# Patient Record
Sex: Female | Born: 1940 | Race: White | Hispanic: No | Marital: Married | Smoking: Never smoker
Health system: Southern US, Community
[De-identification: ages and names within clinical notes are randomized; demographics above are authoritative.]

## PROBLEM LIST (undated history)

## (undated) DIAGNOSIS — C50919 Malignant neoplasm of unspecified site of unspecified female breast: Secondary | ICD-10-CM

## (undated) DIAGNOSIS — E039 Hypothyroidism, unspecified: Secondary | ICD-10-CM

## (undated) DIAGNOSIS — E559 Vitamin D deficiency, unspecified: Principal | ICD-10-CM

## (undated) DIAGNOSIS — R251 Tremor, unspecified: Secondary | ICD-10-CM

## (undated) DIAGNOSIS — Z803 Family history of malignant neoplasm of breast: Secondary | ICD-10-CM

## (undated) DIAGNOSIS — Z923 Personal history of irradiation: Secondary | ICD-10-CM

## (undated) DIAGNOSIS — M8580 Other specified disorders of bone density and structure, unspecified site: Secondary | ICD-10-CM

## (undated) DIAGNOSIS — R569 Unspecified convulsions: Secondary | ICD-10-CM

## (undated) DIAGNOSIS — I10 Essential (primary) hypertension: Secondary | ICD-10-CM

## (undated) DIAGNOSIS — C801 Malignant (primary) neoplasm, unspecified: Secondary | ICD-10-CM

## (undated) HISTORY — PX: THYROIDECTOMY: SHX17

## (undated) HISTORY — DX: Family history of malignant neoplasm of breast: Z80.3

## (undated) HISTORY — DX: Vitamin D deficiency, unspecified: E55.9

## (undated) HISTORY — DX: Malignant (primary) neoplasm, unspecified: C80.1

## (undated) HISTORY — PX: EYE SURGERY: SHX253

## (undated) HISTORY — DX: Other specified disorders of bone density and structure, unspecified site: M85.80

---

## 1968-01-09 HISTORY — PX: ABDOMINAL HYSTERECTOMY: SHX81

## 1998-08-31 ENCOUNTER — Other Ambulatory Visit: Admission: RE | Admit: 1998-08-31 | Discharge: 1998-08-31 | Payer: Self-pay | Admitting: Obstetrics and Gynecology

## 1999-09-07 ENCOUNTER — Other Ambulatory Visit: Admission: RE | Admit: 1999-09-07 | Discharge: 1999-09-07 | Payer: Self-pay | Admitting: Obstetrics and Gynecology

## 2000-10-31 ENCOUNTER — Other Ambulatory Visit: Admission: RE | Admit: 2000-10-31 | Discharge: 2000-10-31 | Payer: Self-pay | Admitting: Obstetrics and Gynecology

## 2000-11-19 ENCOUNTER — Encounter: Payer: Self-pay | Admitting: Obstetrics and Gynecology

## 2000-11-19 ENCOUNTER — Encounter: Admission: RE | Admit: 2000-11-19 | Discharge: 2000-11-19 | Payer: Self-pay | Admitting: Obstetrics and Gynecology

## 2001-01-08 DIAGNOSIS — C50919 Malignant neoplasm of unspecified site of unspecified female breast: Secondary | ICD-10-CM

## 2001-01-08 HISTORY — DX: Malignant neoplasm of unspecified site of unspecified female breast: C50.919

## 2001-06-03 ENCOUNTER — Encounter (INDEPENDENT_AMBULATORY_CARE_PROVIDER_SITE_OTHER): Payer: Self-pay | Admitting: *Deleted

## 2001-06-03 ENCOUNTER — Ambulatory Visit (HOSPITAL_BASED_OUTPATIENT_CLINIC_OR_DEPARTMENT_OTHER): Admission: RE | Admit: 2001-06-03 | Discharge: 2001-06-03 | Payer: Self-pay | Admitting: *Deleted

## 2001-06-09 ENCOUNTER — Encounter: Admission: RE | Admit: 2001-06-09 | Discharge: 2001-06-09 | Payer: Self-pay | Admitting: *Deleted

## 2001-06-09 ENCOUNTER — Encounter: Payer: Self-pay | Admitting: *Deleted

## 2001-06-10 ENCOUNTER — Encounter: Payer: Self-pay | Admitting: *Deleted

## 2001-06-10 ENCOUNTER — Ambulatory Visit (HOSPITAL_BASED_OUTPATIENT_CLINIC_OR_DEPARTMENT_OTHER): Admission: RE | Admit: 2001-06-10 | Discharge: 2001-06-10 | Payer: Self-pay | Admitting: *Deleted

## 2001-06-10 ENCOUNTER — Encounter (INDEPENDENT_AMBULATORY_CARE_PROVIDER_SITE_OTHER): Payer: Self-pay | Admitting: Specialist

## 2001-06-10 HISTORY — PX: BREAST LUMPECTOMY: SHX2

## 2001-06-19 ENCOUNTER — Ambulatory Visit: Admission: RE | Admit: 2001-06-19 | Discharge: 2001-09-17 | Payer: Self-pay | Admitting: Radiation Oncology

## 2001-07-09 ENCOUNTER — Encounter: Payer: Self-pay | Admitting: Oncology

## 2001-07-09 ENCOUNTER — Ambulatory Visit (HOSPITAL_COMMUNITY): Admission: RE | Admit: 2001-07-09 | Discharge: 2001-07-09 | Payer: Self-pay | Admitting: Oncology

## 2001-07-14 ENCOUNTER — Encounter: Payer: Self-pay | Admitting: *Deleted

## 2001-07-14 ENCOUNTER — Ambulatory Visit (HOSPITAL_BASED_OUTPATIENT_CLINIC_OR_DEPARTMENT_OTHER): Admission: RE | Admit: 2001-07-14 | Discharge: 2001-07-14 | Payer: Self-pay | Admitting: *Deleted

## 2001-10-08 ENCOUNTER — Ambulatory Visit: Admission: RE | Admit: 2001-10-08 | Discharge: 2001-12-09 | Payer: Self-pay | Admitting: Radiation Oncology

## 2001-12-24 ENCOUNTER — Ambulatory Visit (HOSPITAL_BASED_OUTPATIENT_CLINIC_OR_DEPARTMENT_OTHER): Admission: RE | Admit: 2001-12-24 | Discharge: 2001-12-24 | Payer: Self-pay | Admitting: *Deleted

## 2002-01-13 ENCOUNTER — Ambulatory Visit: Admission: RE | Admit: 2002-01-13 | Discharge: 2002-01-13 | Payer: Self-pay | Admitting: Radiation Oncology

## 2002-05-04 ENCOUNTER — Other Ambulatory Visit: Admission: RE | Admit: 2002-05-04 | Discharge: 2002-05-04 | Payer: Self-pay | Admitting: Gynecology

## 2002-06-02 ENCOUNTER — Encounter: Admission: RE | Admit: 2002-06-02 | Discharge: 2002-06-02 | Payer: Self-pay | Admitting: Oncology

## 2002-06-02 ENCOUNTER — Encounter: Payer: Self-pay | Admitting: Oncology

## 2002-11-17 ENCOUNTER — Encounter: Admission: RE | Admit: 2002-11-17 | Discharge: 2002-11-17 | Payer: Self-pay | Admitting: Oncology

## 2003-06-04 ENCOUNTER — Encounter: Admission: RE | Admit: 2003-06-04 | Discharge: 2003-06-04 | Payer: Self-pay | Admitting: Oncology

## 2003-06-08 ENCOUNTER — Other Ambulatory Visit: Admission: RE | Admit: 2003-06-08 | Discharge: 2003-06-08 | Payer: Self-pay | Admitting: Gynecology

## 2004-01-18 ENCOUNTER — Ambulatory Visit: Payer: Self-pay | Admitting: Oncology

## 2004-06-06 ENCOUNTER — Encounter: Admission: RE | Admit: 2004-06-06 | Discharge: 2004-06-06 | Payer: Self-pay | Admitting: Oncology

## 2004-06-15 ENCOUNTER — Encounter: Admission: RE | Admit: 2004-06-15 | Discharge: 2004-06-15 | Payer: Self-pay | Admitting: Oncology

## 2004-06-20 ENCOUNTER — Encounter: Admission: RE | Admit: 2004-06-20 | Discharge: 2004-06-20 | Payer: Self-pay | Admitting: Oncology

## 2004-11-23 ENCOUNTER — Ambulatory Visit: Payer: Self-pay | Admitting: Oncology

## 2004-11-24 ENCOUNTER — Ambulatory Visit (HOSPITAL_COMMUNITY): Admission: RE | Admit: 2004-11-24 | Discharge: 2004-11-24 | Payer: Self-pay | Admitting: Oncology

## 2004-12-21 ENCOUNTER — Encounter: Admission: RE | Admit: 2004-12-21 | Discharge: 2004-12-21 | Payer: Self-pay | Admitting: Oncology

## 2005-01-15 ENCOUNTER — Ambulatory Visit: Payer: Self-pay | Admitting: Oncology

## 2005-06-11 ENCOUNTER — Encounter: Admission: RE | Admit: 2005-06-11 | Discharge: 2005-06-11 | Payer: Self-pay | Admitting: Obstetrics & Gynecology

## 2006-01-03 ENCOUNTER — Other Ambulatory Visit: Admission: RE | Admit: 2006-01-03 | Discharge: 2006-01-03 | Payer: Self-pay | Admitting: Gynecology

## 2006-01-05 ENCOUNTER — Ambulatory Visit: Payer: Self-pay | Admitting: Oncology

## 2006-01-10 LAB — CBC WITH DIFFERENTIAL/PLATELET
BASO%: 0.3 % (ref 0.0–2.0)
LYMPH%: 18.7 % (ref 14.0–48.0)
MCH: 30.9 pg (ref 26.0–34.0)
MCHC: 33.5 g/dL (ref 32.0–36.0)
MCV: 92.1 fL (ref 81.0–101.0)
MONO%: 9.8 % (ref 0.0–13.0)
Platelets: 303 10*3/uL (ref 145–400)
RBC: 4.49 10*6/uL (ref 3.70–5.32)

## 2006-01-10 LAB — COMPREHENSIVE METABOLIC PANEL
ALT: 13 U/L (ref 0–35)
Alkaline Phosphatase: 105 U/L (ref 39–117)
Sodium: 142 mEq/L (ref 135–145)
Total Bilirubin: 0.3 mg/dL (ref 0.3–1.2)
Total Protein: 6.9 g/dL (ref 6.0–8.3)

## 2006-06-24 ENCOUNTER — Encounter: Admission: RE | Admit: 2006-06-24 | Discharge: 2006-06-24 | Payer: Self-pay | Admitting: Surgery

## 2006-10-15 ENCOUNTER — Ambulatory Visit: Payer: Self-pay | Admitting: Oncology

## 2006-11-05 LAB — CBC WITH DIFFERENTIAL/PLATELET
Basophils Absolute: 0 10*3/uL (ref 0.0–0.1)
Eosinophils Absolute: 0.1 10*3/uL (ref 0.0–0.5)
HGB: 13 g/dL (ref 11.6–15.9)
LYMPH%: 19.7 % (ref 14.0–48.0)
MCH: 32 pg (ref 26.0–34.0)
MCV: 90.6 fL (ref 81.0–101.0)
MONO%: 8.3 % (ref 0.0–13.0)
NEUT#: 5.1 10*3/uL (ref 1.5–6.5)
Platelets: 247 10*3/uL (ref 145–400)
RBC: 4.05 10*6/uL (ref 3.70–5.32)

## 2006-11-06 LAB — COMPREHENSIVE METABOLIC PANEL
Alkaline Phosphatase: 95 U/L (ref 39–117)
BUN: 19 mg/dL (ref 6–23)
Creatinine, Ser: 0.83 mg/dL (ref 0.40–1.20)
Glucose, Bld: 99 mg/dL (ref 70–99)
Total Bilirubin: 0.3 mg/dL (ref 0.3–1.2)

## 2006-11-25 ENCOUNTER — Ambulatory Visit (HOSPITAL_COMMUNITY): Admission: RE | Admit: 2006-11-25 | Discharge: 2006-11-25 | Payer: Self-pay | Admitting: Oncology

## 2007-07-08 ENCOUNTER — Encounter: Admission: RE | Admit: 2007-07-08 | Discharge: 2007-07-08 | Payer: Self-pay | Admitting: Oncology

## 2007-10-14 ENCOUNTER — Ambulatory Visit: Payer: Self-pay | Admitting: Oncology

## 2007-10-20 LAB — CBC WITH DIFFERENTIAL/PLATELET
EOS%: 2.1 % (ref 0.0–7.0)
Eosinophils Absolute: 0.2 10*3/uL (ref 0.0–0.5)
MCV: 92.4 fL (ref 81.0–101.0)
MONO%: 10.2 % (ref 0.0–13.0)
NEUT#: 4.3 10*3/uL (ref 1.5–6.5)
RBC: 4.03 10*6/uL (ref 3.70–5.32)
RDW: 12.8 % (ref 11.3–14.5)

## 2007-10-21 LAB — COMPREHENSIVE METABOLIC PANEL
AST: 17 U/L (ref 0–37)
Albumin: 4.2 g/dL (ref 3.5–5.2)
Alkaline Phosphatase: 95 U/L (ref 39–117)
Glucose, Bld: 85 mg/dL (ref 70–99)
Potassium: 4 mEq/L (ref 3.5–5.3)
Sodium: 139 mEq/L (ref 135–145)
Total Protein: 6.2 g/dL (ref 6.0–8.3)

## 2008-07-23 ENCOUNTER — Encounter: Admission: RE | Admit: 2008-07-23 | Discharge: 2008-07-23 | Payer: Self-pay | Admitting: Gynecology

## 2008-10-15 ENCOUNTER — Ambulatory Visit: Payer: Self-pay | Admitting: Oncology

## 2008-10-19 LAB — CBC WITH DIFFERENTIAL/PLATELET
BASO%: 0.2 % (ref 0.0–2.0)
Basophils Absolute: 0 10*3/uL (ref 0.0–0.1)
EOS%: 2.1 % (ref 0.0–7.0)
HCT: 36.4 % (ref 34.8–46.6)
LYMPH%: 21.2 % (ref 14.0–49.7)
MCH: 31.6 pg (ref 25.1–34.0)
MCHC: 34.2 g/dL (ref 31.5–36.0)
MCV: 92.6 fL (ref 79.5–101.0)
MONO%: 11 % (ref 0.0–14.0)
NEUT%: 65.5 % (ref 38.4–76.8)
lymph#: 1.3 10*3/uL (ref 0.9–3.3)

## 2008-10-20 LAB — COMPREHENSIVE METABOLIC PANEL
ALT: 11 U/L (ref 0–35)
AST: 15 U/L (ref 0–37)
Alkaline Phosphatase: 90 U/L (ref 39–117)
BUN: 20 mg/dL (ref 6–23)
Creatinine, Ser: 0.79 mg/dL (ref 0.40–1.20)
Total Bilirubin: 0.2 mg/dL — ABNORMAL LOW (ref 0.3–1.2)

## 2009-07-28 ENCOUNTER — Encounter: Admission: RE | Admit: 2009-07-28 | Discharge: 2009-07-28 | Payer: Self-pay | Admitting: Surgery

## 2009-10-19 ENCOUNTER — Ambulatory Visit: Payer: Self-pay | Admitting: Oncology

## 2009-11-21 ENCOUNTER — Ambulatory Visit: Payer: Self-pay | Admitting: Oncology

## 2009-11-21 LAB — CBC WITH DIFFERENTIAL/PLATELET
BASO%: 0.2 % (ref 0.0–2.0)
Eosinophils Absolute: 0.2 10*3/uL (ref 0.0–0.5)
MONO#: 0.6 10*3/uL (ref 0.1–0.9)
MONO%: 10.3 % (ref 0.0–14.0)
NEUT#: 3.8 10*3/uL (ref 1.5–6.5)
RBC: 4.38 10*6/uL (ref 3.70–5.45)
RDW: 13.2 % (ref 11.2–14.5)
WBC: 5.9 10*3/uL (ref 3.9–10.3)

## 2009-11-21 LAB — COMPREHENSIVE METABOLIC PANEL
ALT: 8 U/L (ref 0–35)
Albumin: 4.1 g/dL (ref 3.5–5.2)
Alkaline Phosphatase: 107 U/L (ref 39–117)
CO2: 31 mEq/L (ref 19–32)
Glucose, Bld: 93 mg/dL (ref 70–99)
Potassium: 4.3 mEq/L (ref 3.5–5.3)
Sodium: 141 mEq/L (ref 135–145)
Total Protein: 6.7 g/dL (ref 6.0–8.3)

## 2009-11-21 LAB — VITAMIN D 25 HYDROXY (VIT D DEFICIENCY, FRACTURES): Vit D, 25-Hydroxy: 21 ng/mL — ABNORMAL LOW (ref 30–89)

## 2010-01-28 ENCOUNTER — Encounter: Payer: Self-pay | Admitting: Oncology

## 2010-05-26 NOTE — Op Note (Signed)
[place]. [facility]  Patient:    KABELLA, CASSIDY Visit Number: [id number] MRN: [id number]          Service Type: DSU Location: [facility] Attending Physician:  Kandis Mannan Dictated by:   Donnie Coffin Samuella Cota, M.D. Proc. Date: 07/14/01 Admit Date:  07/14/2001   CC:         Maurine Minister, MD  Valentino Hue. Magrinat, M.D.   Operative Report  PREOPERATIVE DIAGNOSIS:  Carcinoma of the left breast, needs IV access for chemotherapy.  POSTOPERATIVE DIAGNOSIS:  Carcinoma of the left breast, needs IV access for chemotherapy.  OPERATION PERFORMED:  Insertion of LifePort catheter via right subclavian vein.  SURGEON:  Maisie Fus B. Samuella Cota, M.D.  ANESTHESIA:  1% Xylocaine local with anesthesia monitoring, anesthesiologist and CRNA.  DESCRIPTION OF PROCEDURE:  The patient was taken to the operating room and placed on the table in supine position.  A rolled up sheet was placed vertically along the upper thoracic spines.  The neck and chest area was then prepped bilaterally.  Sterile drapes were applied.  The patient was placed in the Trendelenburg position.  1% Xylocaine local was used to infiltrate the skin beneath the right clavicle.  An 18 gauge needle was used to enter the vein and the vein was entered on the first pass of the needle.  The guide wire was placed through the 18 gauge needle and the guide wire was going caudally. The needle was then removed leaving the guide wire.  1% Xylocaine local was used to infiltrate the skin overlying the right chest where a small transverse incision was made and a subcutaneous pocket created inferior to this.  The LifePort catheter was then passed from the area of the subcutaneous pocket to the area of the subclavian stick.  The catheter was then filled with heparinized saline.  The dilator was passed over the guide wire.  Then the dilator and peel-away sheath were placed over the guide wire and the guide wire and dilator were  removed leaving just a peel-away sheath.  The catheter was threaded through the peel-away sheath and the peel-away sheath pulled away.  The catheter was too far distally and under fluoroscopy it was brought back to the area of the superior vena cava.  The catheter was then connected to the lifeport reservoir which had previously been filled with heparinized saline.  The attachment was carried out using the bayonet attachment.  The reservoir was then sutured in the subcutaneous pockets using three interrupted sutures of 3-0 Vicryl.  The position of the catheter was again checked by fluoroscopy.  The catheter seemed to be in good position as was the reservoir. The subcutaneous pocket was then closed with interrupted sutures of 3-0 Vicryl followed by running subcuticular suture of 4-0 Vicryl.  Benzoin and half inch Steri-Strips were used to reinforce the skin closure of the subcutaneous pocket and a strip was also placed over the area for the subclavian stick.  A single 3-0 Vicryl subcuticular suture had previously been placed.  The right ankle Huber needle with the attachment was then passed through the skin into the reservoir.  The system aspirated easily and then the entire system was filled with 5 cc of heparin 100 units per cc.  A dry sterile dressing using 4 x 4s, ABD and 4-inch HypaFix was applied.  The patient seemed to tolerate the procedure well and was taken to the PACU in satisfactory condition. Dictated by:   Donnie Coffin Samuella Cota, M.D.  Attending Physician:  Kandis Mannan DD:  07/14/01 TD:  07/15/01 Job: [id number]

## 2010-06-09 ENCOUNTER — Encounter (INDEPENDENT_AMBULATORY_CARE_PROVIDER_SITE_OTHER): Payer: Self-pay | Admitting: Surgery

## 2010-08-14 ENCOUNTER — Other Ambulatory Visit (INDEPENDENT_AMBULATORY_CARE_PROVIDER_SITE_OTHER): Payer: Self-pay | Admitting: Surgery

## 2010-08-14 DIAGNOSIS — Z9889 Other specified postprocedural states: Secondary | ICD-10-CM

## 2010-08-14 DIAGNOSIS — Z853 Personal history of malignant neoplasm of breast: Secondary | ICD-10-CM

## 2010-08-24 ENCOUNTER — Ambulatory Visit
Admission: RE | Admit: 2010-08-24 | Discharge: 2010-08-24 | Disposition: A | Discharge status: Home / Self Care | Payer: Medicare Other | Source: Ambulatory Visit | Attending: Surgery | Admitting: Surgery

## 2010-08-24 DIAGNOSIS — Z853 Personal history of malignant neoplasm of breast: Secondary | ICD-10-CM

## 2010-08-24 DIAGNOSIS — Z9889 Other specified postprocedural states: Secondary | ICD-10-CM

## 2010-09-18 ENCOUNTER — Encounter (INDEPENDENT_AMBULATORY_CARE_PROVIDER_SITE_OTHER): Payer: Self-pay | Admitting: General Surgery

## 2010-09-18 DIAGNOSIS — Z170 Estrogen receptor positive status [ER+]: Secondary | ICD-10-CM

## 2010-09-18 DIAGNOSIS — C50512 Malignant neoplasm of lower-outer quadrant of left female breast: Secondary | ICD-10-CM | POA: Insufficient documentation

## 2010-09-19 ENCOUNTER — Encounter (INDEPENDENT_AMBULATORY_CARE_PROVIDER_SITE_OTHER): Payer: Self-pay | Admitting: Surgery

## 2010-09-19 ENCOUNTER — Ambulatory Visit (INDEPENDENT_AMBULATORY_CARE_PROVIDER_SITE_OTHER): Payer: Medicare Other | Admitting: Surgery

## 2010-09-19 VITALS — BP 134/76 | HR 64 | Temp 96.7°F

## 2010-09-19 DIAGNOSIS — Z853 Personal history of malignant neoplasm of breast: Secondary | ICD-10-CM

## 2010-09-19 NOTE — Progress Notes (Signed)
NAME: [name]       DOB: 07-Sep-1940           DATE: 09/19/2010       MRN: [id number]   Catherine Carlson is a 70 y.o.[place] Kitchenfemale who presents for routine followup of her Left breast cancer diagnosed in 2003 and treated with lumpectomy, chemo and radiation. She has no problems or concerns on either side.  PFSH She has had no significant changes since the last visit here.  ROS There have been no significant changes since the last visit here  General: The patient is alert, oriented, generally healty appearing, NAD. Mood and affect are normal.  Breasts: The breasts are basically symmetric in appearance. If she continues to have bilateral nipple inversion. They are diffusely irregular but this has been a constant finding. There is no dominant mass and nothing to suggest a local recurrence. Lymphatics: She has no axillary or supraclavicular adenopathy on either side.  Extremities: Full ROM of the surgical side with no lymphedema noted.  Data Reviewed: Recent mammogram is normal  Impression: Doing well, with no evidence of recurrent cancer or new cancer  Plan: Will continue to follow up on an annual basis here.

## 2010-11-15 ENCOUNTER — Other Ambulatory Visit (HOSPITAL_BASED_OUTPATIENT_CLINIC_OR_DEPARTMENT_OTHER): Payer: Medicare Other | Admitting: Lab

## 2010-11-15 ENCOUNTER — Other Ambulatory Visit: Payer: Self-pay | Admitting: Oncology

## 2010-11-15 DIAGNOSIS — Z170 Estrogen receptor positive status [ER+]: Secondary | ICD-10-CM

## 2010-11-15 DIAGNOSIS — C50519 Malignant neoplasm of lower-outer quadrant of unspecified female breast: Secondary | ICD-10-CM

## 2010-11-15 LAB — COMPREHENSIVE METABOLIC PANEL
ALT: 10 U/L (ref 0–35)
AST: 19 U/L (ref 0–37)
Albumin: 4.3 g/dL (ref 3.5–5.2)
Alkaline Phosphatase: 101 U/L (ref 39–117)
BUN: 17 mg/dL (ref 6–23)
CO2: 29 mEq/L (ref 19–32)
Calcium: 9.9 mg/dL (ref 8.4–10.5)
Chloride: 94 mEq/L — ABNORMAL LOW (ref 96–112)
Creatinine, Ser: 0.81 mg/dL (ref 0.50–1.10)
Glucose, Bld: 97 mg/dL (ref 70–99)
Potassium: 3.9 mEq/L (ref 3.5–5.3)
Sodium: 131 mEq/L — ABNORMAL LOW (ref 135–145)
Total Bilirubin: 0.3 mg/dL (ref 0.3–1.2)
Total Protein: 6.5 g/dL (ref 6.0–8.3)

## 2010-11-15 LAB — CBC WITH DIFFERENTIAL/PLATELET
Eosinophils Absolute: 0.2 10*3/uL (ref 0.0–0.5)
HCT: 39.3 % (ref 34.8–46.6)
HGB: 13.4 g/dL (ref 11.6–15.9)
LYMPH%: 23 % (ref 14.0–49.7)
MONO#: 0.6 10*3/uL (ref 0.1–0.9)
NEUT#: 4.2 10*3/uL (ref 1.5–6.5)
NEUT%: 65.1 % (ref 38.4–76.8)
Platelets: 241 10*3/uL (ref 145–400)
RBC: 4.27 10*6/uL (ref 3.70–5.45)
WBC: 6.5 10*3/uL (ref 3.9–10.3)

## 2010-11-16 LAB — COMPREHENSIVE METABOLIC PANEL
ALT: 10 U/L (ref 0–35)
AST: 19 U/L (ref 0–37)
Creatinine, Ser: 0.81 mg/dL (ref 0.50–1.10)
Total Bilirubin: 0.3 mg/dL (ref 0.3–1.2)

## 2010-11-16 LAB — VITAMIN D 25 HYDROXY (VIT D DEFICIENCY, FRACTURES): Vit D, 25-Hydroxy: 17 ng/mL — ABNORMAL LOW (ref 30–89)

## 2010-11-22 ENCOUNTER — Ambulatory Visit: Payer: Medicare Other | Admitting: Oncology

## 2010-11-27 ENCOUNTER — Telehealth: Payer: Self-pay | Admitting: Oncology

## 2010-11-27 NOTE — Telephone Encounter (Signed)
pt called and r/s missed appt on 11/14 to 12/06

## 2010-12-14 ENCOUNTER — Ambulatory Visit (HOSPITAL_BASED_OUTPATIENT_CLINIC_OR_DEPARTMENT_OTHER): Payer: Medicare Other | Admitting: Oncology

## 2010-12-14 VITALS — BP 129/72 | HR 76 | Temp 98.0°F | Ht 63.5 in | Wt 152.3 lb

## 2010-12-14 DIAGNOSIS — C50519 Malignant neoplasm of lower-outer quadrant of unspecified female breast: Secondary | ICD-10-CM

## 2010-12-14 DIAGNOSIS — Z170 Estrogen receptor positive status [ER+]: Secondary | ICD-10-CM

## 2010-12-14 DIAGNOSIS — C50919 Malignant neoplasm of unspecified site of unspecified female breast: Secondary | ICD-10-CM

## 2010-12-14 DIAGNOSIS — M949 Disorder of cartilage, unspecified: Secondary | ICD-10-CM

## 2010-12-15 ENCOUNTER — Telehealth: Payer: Self-pay | Admitting: *Deleted

## 2010-12-15 NOTE — Progress Notes (Signed)
ID: MARKALA SITTS is a 70 year old [place] woman a history of stage II breast cancer   Interval History: Heidemarie is generally doing well. The history since the last visit is dominated by a series of deaths on her husband's side of the family. He lost his only sister to heart disease, a brother to lung cancer, and 2 other relatives to cancer as well. This has been hard in the family.  ROS:  Zienna herself is doing "all right". As she walks about 4 times a week and she does take her husband with her on those walks. She is tolerating the anastrozole with no side effects that she is aware of. A detailed review of systems today was noncontributory.  Medications: I have reviewed the patient's current medications.  Current Outpatient Prescriptions  Medication Sig Dispense Refill  . amLODipine (NORVASC) 5 MG tablet Take 5 mg by mouth daily.        Mar[place] Kitchen anastrozole (ARIMIDEX) 1 MG tablet Take 1 mg by mouth daily.        Marland Kitchen aspirin 81 MG tablet Take 81 mg by mouth daily.        . calcium citrate (CALCITRATE - DOSED IN MG ELEMENTAL CALCIUM) 950 MG tablet Take 1 tablet by mouth daily.        . carbamazepine (TEGRETOL) 200 MG tablet Take 200 mg by mouth 3 (three) times daily.        . fish oil-omega-3 fatty acids 1000 MG capsule Take 2 g by mouth daily.        Marland Kitchen telmisartan-hydrochlorothiazide (MICARDIS HCT) 80-12.5 MG per tablet Take 1 tablet by mouth daily.           Objective:  Filed Vitals:   12/14/10 1417  BP: 129/72  Pulse: 76  Temp: 98 F (36.7 C)    Physical Exam:    Sclerae unicteric  Oropharynx clear  No peripheral adenopathy  Lungs clear -- no rales or rhonchi  Heart regular rate and rhythm  Abdomen benign  MSK no focal spinal tenderness, no peripheral edema  Neuro nonfocal  Breast exam: Right breast no suspicious findings; left breast status post lumpectomy and radiation. No evidence of local recurrence.  Lab Results:   CMP    Chemistry      Component Value  Date/Time   NA 131* 11/15/2010 1333   NA 131* 11/15/2010 1333   K 3.9 11/15/2010 1333   K 3.9 11/15/2010 1333   CL 94* 11/15/2010 1333   CL 94* 11/15/2010 1333   CO2 29 11/15/2010 1333   CO2 29 11/15/2010 1333   BUN 17 11/15/2010 1333   BUN 17 11/15/2010 1333   CREATININE 0.81 11/15/2010 1333   CREATININE 0.81 11/15/2010 1333      Component Value Date/Time   CALCIUM 9.9 11/15/2010 1333   CALCIUM 9.9 11/15/2010 1333   ALKPHOS 101 11/15/2010 1333   ALKPHOS 101 11/15/2010 1333   AST 19 11/15/2010 1333   AST 19 11/15/2010 1333   ALT 10 11/15/2010 1333   ALT 10 11/15/2010 1333   BILITOT 0.3 11/15/2010 1333   BILITOT 0.3 11/15/2010 1333       CBC Lab Results  Component Value Date   WBC 6.5 11/15/2010   HGB 13.4 11/15/2010   HCT 39.3 11/15/2010   MCV 91.9 11/15/2010   PLT 241 11/15/2010   NEUTROABS 4.2 11/15/2010    Studies/Results:  DIGITAL DIAGNOSTIC BILATERAL MAMMOGRAM WITH CAD 08/24/2010 Comparison: 07/28/2009, 07/23/2008 and 07/08/2007  Findings:  Exam demonstrates heterogeneously dense fibroglandular  tissue. There are stable post lumpectomy changes in the outer mid  to lower left breast. Remainder of the exam is unchanged.  Mammographic images were processed with CAD.  IMPRESSION:  Stable post lumpectomy changes of the outer mid to lower left  breast.  Recommendations: Recommend continued annual bilateral screening  mammographic follow-up.  BI-RADS CATEGORY 2: Benign finding(s).  DG Bone Density 11/20/1999  Status: Final result  Study Result     FINDINGS CLINICAL DATA:  HX OSTEOPENIA; D/C HRT 6 MONTHS AGO; TAKES CALCIUM BONE MINERAL DENSITY EXAM LUMBAR SPINE (L1-L4)                    BMD              T(%  of  young adult  value)             Z (%  adult  age match value) 11/02        0.894             -1.4                                                        0.2 3/99          0.933             -1.0 HIP(NECK)                    BMD              T(%  of  young adult  value)               Z (%  adult age match value) 11/02        0.733            -1.0                                                           0.3 3/99          0.700            -1.3 DIAGNOSTIC CATEGORY:    Lumbar spine - SINCE 04/05/97 PROGRESSION FROM LOWER LIMITS OF NORMAL TO SLIGHT OSTEOPENIA. (WHO Criteria)                            Left hip -  SINCE 04/05/97 SLIGHT IMPROVEMENT FROM PREVIOUS SLIGHT OSTEOPENIA TO CURRENT LOWER LIMITS OF NORMAL. CURRENT FRACTURE RISK:  Lumbar spine -  3 TIMES NORMAL             Left hip - 2 TIMES NORMAL DISCUSSION: SLIGHT CHANGES WITH DISCORDANT VALUES WITH DEVELOPMENT OF SLIGHT OSTEOPENIA LUMBAR SPINE AND IMPROVEMENT IN LEFT HIP. DEXA RE-EVALUATION  RECOMMENDATION: IN THREE YEARS AS CLINICALLY INDICATED.   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   Plan: The plan is to continue anastrozole for one additional year. When I see her next October likely we will discharge her from followup here. We will obtain a bone density prior to that visit. She knows to call for any problems that may develop before that.  Malyn Aytes C 12/15/2010

## 2010-12-15 NOTE — Telephone Encounter (Signed)
left voice message to inform the patient of the new date and time of her bone density on 07-30-2011 at 11:00am at the breast center

## 2011-07-20 ENCOUNTER — Encounter (INDEPENDENT_AMBULATORY_CARE_PROVIDER_SITE_OTHER): Payer: Self-pay | Admitting: Surgery

## 2011-07-30 ENCOUNTER — Ambulatory Visit
Admission: RE | Admit: 2011-07-30 | Discharge: 2011-07-30 | Disposition: A | Discharge status: Home / Self Care | Payer: Medicare Other | Source: Ambulatory Visit | Attending: Oncology | Admitting: Oncology

## 2011-07-30 DIAGNOSIS — C50919 Malignant neoplasm of unspecified site of unspecified female breast: Secondary | ICD-10-CM

## 2011-08-31 ENCOUNTER — Other Ambulatory Visit: Payer: Self-pay | Admitting: Oncology

## 2011-08-31 DIAGNOSIS — Z1231 Encounter for screening mammogram for malignant neoplasm of breast: Secondary | ICD-10-CM

## 2011-09-21 ENCOUNTER — Ambulatory Visit
Admission: RE | Admit: 2011-09-21 | Discharge: 2011-09-21 | Disposition: A | Discharge status: Home / Self Care | Payer: Medicare Other | Source: Ambulatory Visit | Attending: Oncology | Admitting: Oncology

## 2011-09-21 DIAGNOSIS — Z1231 Encounter for screening mammogram for malignant neoplasm of breast: Secondary | ICD-10-CM

## 2011-10-12 ENCOUNTER — Ambulatory Visit (INDEPENDENT_AMBULATORY_CARE_PROVIDER_SITE_OTHER): Payer: Medicare Other | Admitting: Surgery

## 2011-10-12 VITALS — BP 138/76 | HR 80 | Temp 97.1°F | Resp 16 | Ht 64.0 in | Wt 150.0 lb

## 2011-10-12 DIAGNOSIS — Z853 Personal history of malignant neoplasm of breast: Secondary | ICD-10-CM

## 2011-10-12 NOTE — Patient Instructions (Signed)
Continue annual mammograms and routine breast self-examinations. We will see you again on an as needed basis. Please call the office at [phone] if you have any questions or concerns. Thank you for allowing [place] to take care of you.

## 2011-10-12 NOTE — Progress Notes (Signed)
NAME: Catherine Carlson       DOB: 1940/07/24           DATE: 10/12/2011       MRN: [id number]   Catherine Carlson is a 71 y.o.[place] Kitchenfemale who presents for routine followup of her Left breast cancer, IDC, Receptor +, Stage II, diagnosed in 2003 and treated with lumpectomy, chemo and radiation. She has no problems or concerns on either side.  PFSH She has had no significant changes since the last visit here.  ROS There have been no significant changes since the last visit here  General: The patient is alert, oriented, generally healty appearing, NAD. Mood and affect are normal.  Breasts: The breasts are basically symmetric in appearance. She continues to have bilateral nipple inversion. They are diffusely irregular but this has been a constant finding. There is no dominant mass and nothing to suggest a local recurrence.There is some loss of tissue at the lumpectomy site Lymphatics: She has no axillary or supraclavicular adenopathy on either side.  Extremities: Full ROM of the surgical side with no lymphedema noted.  Data Reviewed: Mammogram: IMPRESSION:  No mammographic evidence of malignancy.  A result letter of this screening mammogram will be mailed directly  to the patient.  RECOMMENDATION:  Screening mammogram in one year. (Code:SM-B-01Y)  BI-RADS CATEGORY 1: Negative.  Original Report Authenticated By: Sherian Rein, M.D.   Impression: Doing well, with no evidence of recurrent cancer or new cancer  Plan: We'll see back on a when necessary basis

## 2011-10-15 ENCOUNTER — Other Ambulatory Visit (HOSPITAL_BASED_OUTPATIENT_CLINIC_OR_DEPARTMENT_OTHER): Payer: Medicare Other | Admitting: Lab

## 2011-10-15 ENCOUNTER — Encounter: Payer: Self-pay | Admitting: *Deleted

## 2011-10-15 ENCOUNTER — Other Ambulatory Visit: Payer: Self-pay | Admitting: *Deleted

## 2011-10-15 DIAGNOSIS — Z853 Personal history of malignant neoplasm of breast: Secondary | ICD-10-CM

## 2011-10-15 DIAGNOSIS — C50519 Malignant neoplasm of lower-outer quadrant of unspecified female breast: Secondary | ICD-10-CM

## 2011-10-15 DIAGNOSIS — E559 Vitamin D deficiency, unspecified: Secondary | ICD-10-CM

## 2011-10-15 HISTORY — DX: Vitamin D deficiency, unspecified: E55.9

## 2011-10-15 LAB — CBC WITH DIFFERENTIAL/PLATELET
Basophils Absolute: 0 10*3/uL (ref 0.0–0.1)
Eosinophils Absolute: 0.2 10*3/uL (ref 0.0–0.5)
HGB: 13 g/dL (ref 11.6–15.9)
NEUT#: 4.8 10*3/uL (ref 1.5–6.5)
RDW: 12.7 % (ref 11.2–14.5)
WBC: 6.8 10*3/uL (ref 3.9–10.3)
lymph#: 1.4 10*3/uL (ref 0.9–3.3)

## 2011-10-15 LAB — COMPREHENSIVE METABOLIC PANEL (CC13)
AST: 16 U/L (ref 5–34)
Albumin: 3.6 g/dL (ref 3.5–5.0)
BUN: 16 mg/dL (ref 7.0–26.0)
Calcium: 9.7 mg/dL (ref 8.4–10.4)
Chloride: 98 mEq/L (ref 98–107)
Glucose: 116 mg/dl — ABNORMAL HIGH (ref 70–99)
Potassium: 3.8 mEq/L (ref 3.5–5.1)
Total Protein: 6.3 g/dL — ABNORMAL LOW (ref 6.4–8.3)

## 2011-10-16 LAB — VITAMIN D 25 HYDROXY (VIT D DEFICIENCY, FRACTURES): Vit D, 25-Hydroxy: 17 ng/mL — ABNORMAL LOW (ref 30–89)

## 2011-10-22 ENCOUNTER — Ambulatory Visit (HOSPITAL_BASED_OUTPATIENT_CLINIC_OR_DEPARTMENT_OTHER): Payer: Medicare Other | Admitting: Oncology

## 2011-10-22 ENCOUNTER — Encounter: Payer: Self-pay | Admitting: Oncology

## 2011-10-22 ENCOUNTER — Telehealth: Payer: Self-pay | Admitting: *Deleted

## 2011-10-22 ENCOUNTER — Telehealth: Payer: Self-pay | Admitting: Oncology

## 2011-10-22 VITALS — BP 146/76 | HR 73 | Temp 98.2°F | Resp 20 | Ht 64.0 in | Wt 153.6 lb

## 2011-10-22 DIAGNOSIS — Z170 Estrogen receptor positive status [ER+]: Secondary | ICD-10-CM

## 2011-10-22 DIAGNOSIS — M949 Disorder of cartilage, unspecified: Secondary | ICD-10-CM

## 2011-10-22 DIAGNOSIS — E559 Vitamin D deficiency, unspecified: Secondary | ICD-10-CM

## 2011-10-22 DIAGNOSIS — C50519 Malignant neoplasm of lower-outer quadrant of unspecified female breast: Secondary | ICD-10-CM

## 2011-10-22 DIAGNOSIS — Z853 Personal history of malignant neoplasm of breast: Secondary | ICD-10-CM

## 2011-10-22 MED ORDER — ANASTROZOLE 1 MG PO TABS: 1.0000 mg | ORAL_TABLET | Freq: Every day | ORAL | Status: DC

## 2011-10-22 NOTE — Telephone Encounter (Signed)
lmonvm adviisng the pt of her zometa appt on 10/30/2011@8 :30am

## 2011-10-22 NOTE — Progress Notes (Signed)
ID: Catherine Carlson   DOB: September 26, 1940  MR#: [id number]  WUJ#:[id number]  PCP: Mechele Dawley, MD GYN:  SU:  OTHER MD:  HISTORY OF PRESENT ILLNESS:    Catherine Carlson is a 71 year old female who early in 2003, noticed approximately a pea-sized nodule in the lower outer quadrant of her left breast.  She routinely performed self-breast examinations, and this was a new finding for her. She denies any pain in the breast area, nipple discharge or bleeding. The patient had undergone a routine screening mammogram at [facility] on April 22, 2001 which showed no suspicious areas in either breast.  The patient proceeded to be seen by Dr. Samuella Cota, who palpated a lesion in the 4 o'clock position of the breast which was very close to the skin.  Ultrasound of this area revealed it was not a simple cyst.  In light of the above findings, the patient proceeded to undergo excisional biopsy with pathology returning invasive ductal carcinoma, Grade 2 out of 3. 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  The tumor did focally involve the superficial margin. In light of this, the patient proceeded to undergo reexcision.  In addition, at the same time, she underwent a sentinel node procedure. The patient was found to have no residual carcinoma in the reexcision.  The sentinel node specimen revealed no evidence of metastatic disease.  Her subsequent history is as detailed below   INTERVAL HISTORY: Catherine Carlson returns today for followup of her breast cancer. Interval history is generally unremarkable. She is very active at church and with other groups. Family is doing well.  REVIEW OF SYSTEMS: Her biggest concern is falling. She recently fell and injured her right forearm. She denies dizziness, headaches, or visual changes. She does have an appointment with her eye doctor coming up. Otherwise a detailed review of systems today was  noncontributory.   PAST MEDICAL HISTORY: Past Medical History  Diagnosis Date  . Cancer   . Family history of breast cancer     PGM  . Vitamin D deficiency 10/15/2011  . Osteopenia   :    The patient underwent a hysterectomy in 1973 for endometriosis.  She has a history of hypertension.  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  The patient was previously on Synthroid in the past for thyroid problems but none currently.  The patient has retained one ovary.    PAST SURGICAL HISTORY: Past Surgical History  Procedure Date  . Abdominal hysterectomy 1970  . Breast lumpectomy 06/10/2001    Left - Dr Ronnette Juniper    FAMILY HISTORY Family History  Problem Relation Age of Onset  . Heart disease Mother   . Heart disease Father   Significant for paternal grandmother with history of breast cancer, diagnosed at age 19.   There is no other family history of breast or gynecologic malignancies or other type malignancies.    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 Her husband of 53 years, Sherilyn Cooter, was "troubleshoot her" for a local company. They have an adopted son, Ron, 53, who runs the patient's farm. They grow tobacco, soybean's, and week. She has 2 grandsons, age 27 and 9. The patient is a International aid/development worker.   ADVANCED DIRECTIVES: in place  HEALTH MAINTENANCE: History  Substance Use Topics  . Smoking  status: Never Smoker   . Smokeless tobacco: Never Used  . Alcohol Use: No     Colonoscopy: 2012  PAP:  Bone density: July 2013  Lipid panel:  Allergies  Allergen Reactions  . Morphine And Related Itching, Nausea And Vomiting and Other (See Comments)    Caused GI upset. Also caused patient to black out. Unaware of what was going on around her. Need help to move around.  . Penicillins Anaphylaxis and Hives    Hives go around the neck  . Codeine Nausea And Vomiting     Current Outpatient Prescriptions  Medication Sig Dispense Refill  . amLODipine (NORVASC) 5 MG tablet Take 5 mg by mouth daily.        Marland Kitchen anastrozole (ARIMIDEX) 1 MG tablet Take 1 tablet (1 mg total) by mouth daily.  90 tablet  12  . anastrozole (ARIMIDEX) 1 MG tablet Take 1 tablet (1 mg total) by mouth daily.  90 tablet  12  . aspirin 81 MG tablet Take 81 mg by mouth daily.        . calcium citrate (CALCITRATE - DOSED IN MG ELEMENTAL CALCIUM) 950 MG tablet Take 1 tablet by mouth daily.        . carbamazepine (TEGRETOL) 200 MG tablet Take 200 mg by mouth 3 (three) times daily.        . fish oil-omega-3 fatty acids 1000 MG capsule Take 2 g by mouth daily.        Marland Kitchen telmisartan-hydrochlorothiazide (MICARDIS HCT) 80-25 MG per tablet Take 1 tablet by mouth daily.        OBJECTIVE: Elderly white woman who appears well Filed Vitals:   10/22/11 1322  BP: 146/76  Pulse: 73  Temp: 98.2 F (36.8 C)  Resp: 20     Body mass index is 26.37 kg/(m^2).    ECOG FS: 0  Sclerae unicteric Oropharynx clear No cervical or supraclavicular adenopathy Lungs no rales or rhonchi Heart regular rate and rhythm Abd benign MSK scoliosis but no focal tenderness Neuro: nonfocal; she has an unremarkable walk, can do tandem walk with a little unsteadiness, and Romberg was negative. Breasts: The right breast is unremarkable. The left breast is status post lumpectomy. There is no evidence of local recurrence.  LAB RESULTS: Lab Results  Component Value Date   WBC 6.8 10/15/2011   NEUTROABS 4.8 10/15/2011   HGB 13.0 10/15/2011   HCT 39.2 10/15/2011   MCV 92.9 10/15/2011   PLT 226 10/15/2011      Chemistry      Component Value Date/Time   NA 136 10/15/2011 1342   NA 131* 11/15/2010 1333   NA 131* 11/15/2010 1333   K 3.8 10/15/2011 1342   K 3.9 11/15/2010 1333   K 3.9 11/15/2010 1333   CL 98 10/15/2011 1342   CL 94* 11/15/2010 1333   CL 94* 11/15/2010 1333   CO2 30* 10/15/2011 1342   CO2 29 11/15/2010 1333   CO2  29 11/15/2010 1333   BUN 16.0 10/15/2011 1342   BUN 17 11/15/2010 1333   BUN 17 11/15/2010 1333   CREATININE 0.9 10/15/2011 1342   CREATININE 0.81 11/15/2010 1333   CREATININE 0.81 11/15/2010 1333      Component Value Date/Time   CALCIUM 9.7 10/15/2011 1342   CALCIUM 9.9 11/15/2010 1333   CALCIUM 9.9 11/15/2010 1333   ALKPHOS 106 10/15/2011 1342   ALKPHOS 101 11/15/2010 1333   ALKPHOS 101 11/15/2010 1333   AST 16 10/15/2011 1342  AST 19 11/15/2010 1333   AST 19 11/15/2010 1333   ALT 11 10/15/2011 1342   ALT 10 11/15/2010 1333   ALT 10 11/15/2010 1333   BILITOT 0.20 10/15/2011 1342   BILITOT 0.3 11/15/2010 1333   BILITOT 0.3 11/15/2010 1333       Lab Results  Component Value Date   LABCA2 20 10/15/2011    No components found with this basename: LABCA125    No results found for this basename: INR:1;PROTIME:1 in the last 168 hours  Urinalysis No results found for this basename: colorurine,  appearanceur,  labspec,  phurine,  glucoseu,  hgbur,  bilirubinur,  ketonesur,  proteinur,  urobilinogen,  nitrite,  leukocytesur    STUDIES: No results found.  ASSESSMENT: 71 y.o. [place] woman status post left lumpectomy and sentinel lymph node biopsy June 2003 for a T2 N0 invasive ductal carcinoma, ER positive, PR and HER2 negative, status post Cytoxan and Adriamycin times four followed by radiation, followed by Arimidex started in October 2003  PLAN: She really wants to stay on Arimidex. I have explained that we do not have data continuing beyond 10 years, and I am concerned regarding the possible effects on bone. She does have moderate osteopenia. After much discussion of him a and considering that she otherwise is tolerating this medication very well (whereas she might have other problems with tamoxifen) the plan is to continue Arimidex indefinitely, but also add of a bisphosphonate to her regimen. We then discussed the differences between Fosamax, Boniva, and the other oral bisphosphonates, versus  pamidronate and zoledronic acid.  She's retracted to the idea zoledronic acid because it is once a year. She understands the dangers of osteonecrosis of the jaw and she will mention to her dentist next visit that she is on this new medication. She will receive her dose next week. Otherwise we are going to continue yearly followup for [facility]. She is going to increase your vitamin D intake to 2000 mg daily to see if we can make a little headway from that angle as well. She knows to call for any problems that may develop before the next visit.   MAGRINAT,GUSTAV C    10/22/2011

## 2011-10-22 NOTE — Telephone Encounter (Signed)
Per staff message and POF I have scheduled appt.  JMW  

## 2011-10-22 NOTE — Telephone Encounter (Signed)
Disregard the prev note: gve the pt the wrong appt calendar. Updated the schedule for 1 year appts

## 2011-10-22 NOTE — Telephone Encounter (Signed)
gve the pt her oct 2014 appt calendar. Pt is aware that we will call her with the zometa appt.  Sent michelle a staff message.

## 2011-10-26 ENCOUNTER — Telehealth: Payer: Self-pay | Admitting: *Deleted

## 2011-10-26 NOTE — Telephone Encounter (Signed)
Per patient request I have rescheduled appt from 10/22 to 10/24.  JMW

## 2011-10-30 ENCOUNTER — Ambulatory Visit: Payer: Medicare Other

## 2011-11-01 ENCOUNTER — Other Ambulatory Visit: Payer: Self-pay | Admitting: Physician Assistant

## 2011-11-01 ENCOUNTER — Ambulatory Visit (HOSPITAL_BASED_OUTPATIENT_CLINIC_OR_DEPARTMENT_OTHER): Payer: Medicare Other

## 2011-11-01 VITALS — BP 150/86 | HR 67 | Temp 96.8°F | Resp 16

## 2011-11-01 DIAGNOSIS — M899 Disorder of bone, unspecified: Secondary | ICD-10-CM

## 2011-11-01 DIAGNOSIS — Z853 Personal history of malignant neoplasm of breast: Secondary | ICD-10-CM

## 2011-11-01 MED ORDER — ZOLEDRONIC ACID 4 MG/5ML IV CONC
4.0000 mg | Freq: Once | INTRAVENOUS | Status: AC
  Administered 2011-11-01: 4 mg via INTRAVENOUS
  Filled 2011-11-01: qty 5

## 2011-11-01 MED ORDER — SODIUM CHLORIDE 0.9 % IV SOLN
Freq: Once | INTRAVENOUS | Status: AC
  Administered 2011-11-01: 10:00:00 via INTRAVENOUS

## 2011-11-01 NOTE — Patient Instructions (Addendum)
[facility] Discharge Instructions for Patients Receiving Chemotherapy  Today you received the following chemotherapy agents zometa  If you develop nausea and vomiting that is not controlled by your nausea medication, call the clinic. If it is after clinic hours your family physician or the after hours number for the clinic or go to the Emergency Department.   BELOW ARE SYMPTOMS THAT SHOULD BE REPORTED IMMEDIATELY:  *FEVER GREATER THAN 100.5 F  *CHILLS WITH OR WITHOUT FEVER  NAUSEA AND VOMITING THAT IS NOT CONTROLLED WITH YOUR NAUSEA MEDICATION  *UNUSUAL SHORTNESS OF BREATH  *UNUSUAL BRUISING OR BLEEDING  TENDERNESS IN MOUTH AND THROAT WITH OR WITHOUT PRESENCE OF ULCERS  *URINARY PROBLEMS  *BOWEL PROBLEMS  UNUSUAL RASH Items with * indicate a potential emergency and should be followed up as soon as possible.  One of the nurses will contact you 24 hours after your treatment. Please let the nurse know about any problems that you may have experienced. Feel free to call the clinic you have any questions or concerns. The clinic phone number is [phone].   I have been informed and understand all the instructions given to me. I know to contact the clinic, my physician, or go to the Emergency Department if any problems should occur. I do not have any questions at this time, but understand that I may call the clinic during office hours   should I have any questions or need assistance in obtaining follow up care.    __________________________________________  _____________  __________ Signature of Patient or Authorized Representative            Date                   Time    __________________________________________ Nurse's Signature

## 2012-04-10 ENCOUNTER — Other Ambulatory Visit: Payer: Medicare Other | Admitting: Lab

## 2012-04-17 ENCOUNTER — Ambulatory Visit: Payer: Medicare Other | Admitting: Physician Assistant

## 2012-05-15 DIAGNOSIS — E0590 Thyrotoxicosis, unspecified without thyrotoxic crisis or storm: Secondary | ICD-10-CM | POA: Insufficient documentation

## 2012-09-25 ENCOUNTER — Other Ambulatory Visit: Payer: Self-pay

## 2012-09-25 DIAGNOSIS — Z1231 Encounter for screening mammogram for malignant neoplasm of breast: Secondary | ICD-10-CM

## 2012-10-15 ENCOUNTER — Other Ambulatory Visit: Payer: Self-pay | Admitting: Physician Assistant

## 2012-10-15 DIAGNOSIS — Z853 Personal history of malignant neoplasm of breast: Secondary | ICD-10-CM

## 2012-10-15 DIAGNOSIS — E559 Vitamin D deficiency, unspecified: Secondary | ICD-10-CM

## 2012-10-16 ENCOUNTER — Other Ambulatory Visit (HOSPITAL_BASED_OUTPATIENT_CLINIC_OR_DEPARTMENT_OTHER): Payer: Medicare Other | Admitting: Lab

## 2012-10-16 DIAGNOSIS — E559 Vitamin D deficiency, unspecified: Secondary | ICD-10-CM

## 2012-10-16 DIAGNOSIS — Z853 Personal history of malignant neoplasm of breast: Secondary | ICD-10-CM

## 2012-10-16 LAB — CBC WITH DIFFERENTIAL/PLATELET
BASO%: 0.2 % (ref 0.0–2.0)
LYMPH%: 13.8 % — ABNORMAL LOW (ref 14.0–49.7)
MCHC: 33.6 g/dL (ref 31.5–36.0)
MONO#: 0.6 10*3/uL (ref 0.1–0.9)
RBC: 4.52 10*6/uL (ref 3.70–5.45)
RDW: 13 % (ref 11.2–14.5)
WBC: 6.8 10*3/uL (ref 3.9–10.3)
lymph#: 0.9 10*3/uL (ref 0.9–3.3)

## 2012-10-16 LAB — COMPREHENSIVE METABOLIC PANEL (CC13)
ALT: 9 U/L (ref 0–55)
Anion Gap: 9 mEq/L (ref 3–11)
CO2: 32 mEq/L — ABNORMAL HIGH (ref 22–29)
Chloride: 99 mEq/L (ref 98–109)
Sodium: 140 mEq/L (ref 136–145)
Total Bilirubin: 0.37 mg/dL (ref 0.20–1.20)
Total Protein: 7 g/dL (ref 6.4–8.3)

## 2012-10-17 ENCOUNTER — Ambulatory Visit
Admission: RE | Admit: 2012-10-17 | Discharge: 2012-10-17 | Disposition: A | Discharge status: Home / Self Care | Payer: Medicare Other | Source: Ambulatory Visit

## 2012-10-17 DIAGNOSIS — Z1231 Encounter for screening mammogram for malignant neoplasm of breast: Secondary | ICD-10-CM

## 2012-10-17 LAB — VITAMIN D 25 HYDROXY (VIT D DEFICIENCY, FRACTURES): Vit D, 25-Hydroxy: 17 ng/mL — ABNORMAL LOW (ref 30–89)

## 2012-10-23 ENCOUNTER — Ambulatory Visit (HOSPITAL_BASED_OUTPATIENT_CLINIC_OR_DEPARTMENT_OTHER): Payer: Medicare Other | Admitting: Physician Assistant

## 2012-10-23 ENCOUNTER — Telehealth: Payer: Self-pay | Admitting: *Deleted

## 2012-10-23 ENCOUNTER — Encounter: Payer: Self-pay | Admitting: Physician Assistant

## 2012-10-23 ENCOUNTER — Encounter (INDEPENDENT_AMBULATORY_CARE_PROVIDER_SITE_OTHER): Payer: Self-pay

## 2012-10-23 VITALS — BP 136/63 | HR 75 | Temp 97.8°F | Resp 20 | Ht 64.0 in | Wt 142.2 lb

## 2012-10-23 DIAGNOSIS — M8580 Other specified disorders of bone density and structure, unspecified site: Secondary | ICD-10-CM

## 2012-10-23 DIAGNOSIS — E559 Vitamin D deficiency, unspecified: Secondary | ICD-10-CM

## 2012-10-23 DIAGNOSIS — M899 Disorder of bone, unspecified: Secondary | ICD-10-CM

## 2012-10-23 DIAGNOSIS — C50519 Malignant neoplasm of lower-outer quadrant of unspecified female breast: Secondary | ICD-10-CM

## 2012-10-23 DIAGNOSIS — Z1231 Encounter for screening mammogram for malignant neoplasm of breast: Secondary | ICD-10-CM

## 2012-10-23 DIAGNOSIS — Z853 Personal history of malignant neoplasm of breast: Secondary | ICD-10-CM

## 2012-10-23 DIAGNOSIS — Z170 Estrogen receptor positive status [ER+]: Secondary | ICD-10-CM

## 2012-10-23 DIAGNOSIS — Z780 Asymptomatic menopausal state: Secondary | ICD-10-CM

## 2012-10-23 MED ORDER — ANASTROZOLE 1 MG PO TABS: 1.0000 mg | ORAL_TABLET | Freq: Every day | ORAL | Status: DC

## 2012-10-23 NOTE — Telephone Encounter (Signed)
Per staff message and POF I have scheduled appts.  JMW  

## 2012-10-23 NOTE — Telephone Encounter (Signed)
appts made and printed. Pt is aware that tx will be added. i emailed MW to add the tx. Pt is aware that i will call w/ appt time for tx...td

## 2012-10-23 NOTE — Progress Notes (Signed)
ID: Catherine Carlson   DOB: 11-27-1940  MR#: [id number]  WUJ#:[id number]  PCP: Mechele Dawley, MD GYN:  SU:  OTHER MD:  CHIEF COMPLAINT:  Left Breast Cancer   HISTORY OF PRESENT ILLNESS:    Catherine Carlson is a 72 year old female who early in 2003, noticed approximately a pea-sized nodule in the lower outer quadrant of her left breast.  She routinely performed self-breast examinations, and this was a new finding for her. She denies any pain in the breast area, nipple discharge or bleeding. The patient had undergone a routine screening mammogram at [facility] on April 22, 2001 which showed no suspicious areas in either breast.  The patient proceeded to be seen by Dr. Samuella Cota, who palpated a lesion in the 4 o'clock position of the breast which was very close to the skin.  Ultrasound of this area revealed it was not a simple cyst.  In light of the above findings, the patient proceeded to undergo excisional biopsy with pathology returning invasive ductal carcinoma, Grade 2 out of 3. 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  The tumor did focally involve the superficial margin. In light of this, the patient proceeded to undergo reexcision.  In addition, at the same time, she underwent a sentinel node procedure. The patient was found to have no residual carcinoma in the reexcision.  The sentinel node specimen revealed no evidence of metastatic disease.  Her subsequent history is as detailed below   INTERVAL HISTORY: Catherine Carlson returns today accompanied by her sister-in-law Steward Drone for followup of her left breast cancer. Interval history is generally unremarkable. She continues to be very active, and participate in multiple clubs and organizations. She tells me she is actually hoping to slow down a little because she feels she is too busy at times. Physically, however, she is feeling very well.   Catherine Carlson continues on  anastrozole with good tolerance. She has no problems with hot flashes, denies vaginal dryness, and has had no increased joint pain since beginning this medication. She does have some osteopenia as documented by bone density in July 2013, and received her first annual dose of zoledronic acid one year ago. She is due for her second dose this month. She tells me she tolerated the drug very well one year ago, with no significant bony pain and no complications. She does have a continued vitamin D deficiency, lab showing a level of 17 today. She is taking vitamin D, but is only taking 1000 iU daily at the current time.  Interval history is notable only for the apparent diagnosis of hypothyroidism for which she was started on medication. She's being followed closely by her primary care physician, Dr. Jacqulyn Bath. This was diagnosed last year following one episode of falling, and 2 episodes of fainting. She's had no falls or loss of consciousness since this was diagnosed and treated.   REVIEW OF SYSTEMS: Catherine Carlson has had no recent illnesses and denies any fevers, chills, or night sweats. Her appetite is good and she denies any nausea, emesis, or change in bowel or bladder habits. She denies any dental issues and has had no jaw pain. She also denies any increased cough, shortness of breath, chest pain, or palpitations. She has no unusual myalgias, arthralgias, or bony pain, and has had no peripheral swelling. She denies abnormal headaches, dizziness, or change in vision.  A detailed review of systems is otherwise stable and noncontributory.  PAST MEDICAL HISTORY:  Past Medical History  Diagnosis Date  . Cancer   . Family history of breast cancer     PGM  . Vitamin D deficiency 10/15/2011  . Osteopenia   :    The patient underwent a hysterectomy in 1973 for endometriosis.  She has a history of hypertension.  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  The patient  was previously on Synthroid in the past for thyroid problems but none currently.  The patient has retained one ovary.    PAST SURGICAL HISTORY: Past Surgical History  Procedure Laterality Date  . Abdominal hysterectomy  1970  . Breast lumpectomy  06/10/2001    Left - Dr Ronnette Juniper    FAMILY HISTORY Family History  Problem Relation Age of Onset  . Heart disease Mother   . Heart disease Father   Significant for paternal grandmother with history of breast cancer, diagnosed at age 72.   There is no other family history of breast or gynecologic malignancies or other type malignancies.    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 Her husband of 53 years, Sherilyn Cooter, was "troubleshoot her" for a local company. They have an adopted son, Ron, 67, who runs the patient's farm. They grow tobacco, soybean's, and week. She has 2 grandsons, age 46 and 86. The patient is a International aid/development worker.   ADVANCED DIRECTIVES: in place  HEALTH MAINTENANCE: (Updated 10/23/2012) History  Substance Use Topics  . Smoking status: Never Smoker   . Smokeless tobacco: Never Used  . Alcohol Use: No     Colonoscopy: 2012  PAP: Status post hysterectomy  Bone density: July 2013, osteopenia  Lipid panel: Dr. Yetta Barre  Allergies  Allergen Reactions  . Morphine And Related Itching, Nausea And Vomiting and Other (See Comments)    Caused GI upset. Also caused patient to black out. Unaware of what was going on around her. Need help to move around.  . Penicillins Anaphylaxis and Hives    Hives go around the neck  . Codeine Nausea And Vomiting    Current Outpatient Prescriptions  Medication Sig Dispense Refill  . amLODipine (NORVASC) 5 MG tablet Take 5 mg by mouth daily.        Marland Kitchen anastrozole (ARIMIDEX) 1 MG tablet Take 1 tablet (1 mg total) by mouth daily.  90 tablet  3  . aspirin 81 MG tablet Take 81 mg by mouth daily.        . Bepotastine Besilate (BEPREVE)  1.5 % SOLN Place 1 drop into the left eye 2 (two) times daily.      . calcium citrate (CALCITRATE - DOSED IN MG ELEMENTAL CALCIUM) 950 MG tablet Take 1 tablet by mouth daily.        . carbamazepine (TEGRETOL) 200 MG tablet Take 200 mg by mouth 3 (three) times daily.        . cholecalciferol (VITAMIN D) 1000 UNITS tablet Take 1,000 Units by mouth daily.      Marland Kitchen escitalopram (LEXAPRO) 10 MG tablet Take 10 mg by mouth daily.      . fish oil-omega-3 fatty acids 1000 MG capsule Take 2 g by mouth daily.        . methimazole (TAPAZOLE) 5 MG tablet Take 5 mg by mouth daily.      Marland Kitchen telmisartan-hydrochlorothiazide (MICARDIS HCT) 80-25 MG per tablet Take 1 tablet by mouth daily.  No current facility-administered medications for this visit.    OBJECTIVE: Elderly white Carlson who appears well and is in no acute distress Filed Vitals:   10/23/12 0949  BP: 136/63  Pulse: 75  Temp: 97.8 F (36.6 C)  Resp: 20     Body mass index is 24.4 kg/(m^2).    ECOG FS: 0 Filed Weights   10/23/12 0949  Weight: 142 lb 3.2 oz (64.501 kg)   Physical Exam: HEENT:  Sclerae anicteric.  Oropharynx clear. Buccal mucosa is pink and moist. NODES:  No cervical or supraclavicular lymphadenopathy palpated.  BREAST EXAM: Right breast is unremarkable. Left breast is status post lumpectomy with no evidence of local recurrence. No specialist nodularity or skin changes. Axillae are benign bilaterally, no palpable adenopathy. LUNGS:  Clear to auscultation bilaterally.  No wheezes or rhonchi HEART:  Regular rate and rhythm. No murmur appreciated  ABDOMEN:  Soft, nontender.  Positive bowel sounds.  MSK:  No focal spinal tenderness to palpation. Kyphosis noted on exam. EXTREMITIES:  No peripheral edema.   NEURO:  Nonfocal. Well oriented.  Positive affect.    LAB RESULTS: Lab Results  Component Value Date   WBC 6.8 10/16/2012   NEUTROABS 5.2 10/16/2012   HGB 14.0 10/16/2012   HCT 41.7 10/16/2012   MCV 92.3 10/16/2012   PLT  216 10/16/2012      Chemistry      Component Value Date/Time   NA 140 10/16/2012 0913   NA 131* 11/15/2010 1333   NA 131* 11/15/2010 1333   K 3.3* 10/16/2012 0913   K 3.9 11/15/2010 1333   K 3.9 11/15/2010 1333   CL 98 10/15/2011 1342   CL 94* 11/15/2010 1333   CL 94* 11/15/2010 1333   CO2 32* 10/16/2012 0913   CO2 29 11/15/2010 1333   CO2 29 11/15/2010 1333   BUN 16.8 10/16/2012 0913   BUN 17 11/15/2010 1333   BUN 17 11/15/2010 1333   CREATININE 0.9 10/16/2012 0913   CREATININE 0.81 11/15/2010 1333   CREATININE 0.81 11/15/2010 1333      Component Value Date/Time   CALCIUM 9.9 10/16/2012 0913   CALCIUM 9.9 11/15/2010 1333   CALCIUM 9.9 11/15/2010 1333   ALKPHOS 110 10/16/2012 0913   ALKPHOS 101 11/15/2010 1333   ALKPHOS 101 11/15/2010 1333   AST 14 10/16/2012 0913   AST 19 11/15/2010 1333   AST 19 11/15/2010 1333   ALT 9 10/16/2012 0913   ALT 10 11/15/2010 1333   ALT 10 11/15/2010 1333   BILITOT 0.37 10/16/2012 0913   BILITOT 0.3 11/15/2010 1333   BILITOT 0.3 11/15/2010 1333      Vitamin D 25 Hydroxy 17  10/16/2012     17  10/15/2011      17  11/15/2010   STUDIES:  Mm Digital Screening  10/17/2012   *RADIOLOGY REPORT*  Clinical Data: Screening.  DIGITAL SCREENING BILATERAL MAMMOGRAM WITH CAD  Comparison:  Previous exam(s).  FINDINGS:  ACR Breast Density Category c:  The breast tissue is heterogeneously dense, which may obscure small masses.  There are no findings suspicious for malignancy.  Images were processed with CAD.  IMPRESSION: No mammographic evidence of malignancy.  A result letter of this screening mammogram will be mailed directly to the patient.  RECOMMENDATION: Screening mammogram in one year. (Code:SM-B-01Y)  BI-RADS CATEGORY 1:  Negative.   Original Report Authenticated By: Baird Lyons, M.D.    Most recent bone density on 07/30/2011 showed osteopenia.  ASSESSMENT: 72 y.o.  Catherine Carlson   (1)  status post left lumpectomy and sentinel lymph node biopsy June 2003 for a T2 N0  invasive ductal carcinoma, ER positive, PR and HER2 negative,   (2)  status post Cytoxan and Adriamycin times four  (3)  Received radiation therapy, completed October 2003   (4)  Arimidex started in October 2003.  Continues with good tolerance, the plan being to continue indefinitely  (5) osteopenia, currently receiving zoledronic acid annually, with first dose on 10/26/2011. Due again in October 2014    PLAN:   Catherine Carlson is doing well, is tolerating her regimen well, and we are making no changes. I have refilled her anastrozole for another year, and we are scheduling her for her next annual dose of zoledronic acid later this month. She'll be due for her next mammogram and we will repeat her bone density in October 2015. She'll return soon thereafter for repeat labs and physical exam with Dr. Darnelle Catalan, as well as her next annual dose of zoledronic acid. She'll continue to be followed regularly for dental care.   In the meanwhile, I have suggested that [facility] increase her vitamin D intake to 2000 -3000 units daily. I also encouraged her to walk on a daily basis to increase bone strength.   Jaicey voices understanding and agreement with this plan.  She knows to call for any problems that may develop before the next visit.   Roland Prine    10/23/2012

## 2012-10-24 ENCOUNTER — Telehealth: Payer: Self-pay | Admitting: *Deleted

## 2012-10-24 NOTE — Telephone Encounter (Signed)
sw pt gv appt for tx on 11/06/12 @ 8:30am...td

## 2012-11-06 ENCOUNTER — Other Ambulatory Visit: Payer: Self-pay | Admitting: Oncology

## 2012-11-06 ENCOUNTER — Ambulatory Visit (HOSPITAL_BASED_OUTPATIENT_CLINIC_OR_DEPARTMENT_OTHER): Payer: Medicare Other

## 2012-11-06 VITALS — BP 144/62 | HR 74 | Temp 97.9°F | Resp 18

## 2012-11-06 DIAGNOSIS — E559 Vitamin D deficiency, unspecified: Secondary | ICD-10-CM

## 2012-11-06 DIAGNOSIS — Z853 Personal history of malignant neoplasm of breast: Secondary | ICD-10-CM

## 2012-11-06 DIAGNOSIS — M8580 Other specified disorders of bone density and structure, unspecified site: Secondary | ICD-10-CM

## 2012-11-06 DIAGNOSIS — M899 Disorder of bone, unspecified: Secondary | ICD-10-CM

## 2012-11-06 DIAGNOSIS — Z780 Asymptomatic menopausal state: Secondary | ICD-10-CM

## 2012-11-06 MED ORDER — ZOLEDRONIC ACID 4 MG/5ML IV CONC
4.0000 mg | Freq: Once | INTRAVENOUS | Status: DC
  Filled 2012-11-06: qty 5

## 2012-11-06 MED ORDER — ZOLEDRONIC ACID 4 MG/100ML IV SOLN
4.0000 mg | Freq: Once | INTRAVENOUS | Status: DC
  Filled 2012-11-06: qty 100

## 2012-11-06 MED ORDER — SODIUM CHLORIDE 0.9 % IV SOLN
Freq: Once | INTRAVENOUS | Status: AC
  Administered 2012-11-06: 09:00:00 via INTRAVENOUS

## 2012-11-06 MED ORDER — ZOLEDRONIC ACID 4 MG/100ML IV SOLN
4.0000 mg | Freq: Once | INTRAVENOUS | Status: AC
  Administered 2012-11-06: 4 mg via INTRAVENOUS
  Filled 2012-11-06: qty 100

## 2012-11-06 NOTE — Patient Instructions (Signed)
Zoledronic Acid injection - Zometa (Hypercalcemia, Oncology) What is this medicine? ZOLEDRONIC ACID (ZOE le dron ik AS id) lowers the amount of calcium loss from bone. It is used to treat too much calcium in your blood from cancer. It is also used to prevent complications of cancer that has spread to the bone. This medicine may be used for other purposes; ask your health care provider or pharmacist if you have questions. What should I tell my health care provider before I take this medicine? They need to know if you have any of these conditions: -aspirin-sensitive asthma -dental disease -kidney disease -an unusual or allergic reaction to zoledronic acid, other medicines, foods, dyes, or preservatives -pregnant or trying to get pregnant -breast-feeding How should I use this medicine? This medicine is for infusion into a vein. It is given by a health care professional in a hospital or clinic setting. Talk to your pediatrician regarding the use of this medicine in children. Special care may be needed. Overdosage: If you think you have taken too much of this medicine contact a poison control center or emergency room at once. NOTE: This medicine is only for you. Do not share this medicine with others. What if I miss a dose? It is important not to miss your dose. Call your doctor or health care professional if you are unable to keep an appointment. What may interact with this medicine? -certain antibiotics given by injection -NSAIDs, medicines for pain and inflammation, like ibuprofen or naproxen -some diuretics like bumetanide, furosemide -teriparatide -thalidomide This list may not describe all possible interactions. Give your health care provider a list of all the medicines, herbs, non-prescription drugs, or dietary supplements you use. Also tell them if you smoke, drink alcohol, or use illegal drugs. Some items may interact with your medicine. What should I watch for while using this  medicine? Visit your doctor or health care professional for regular checkups. It may be some time before you see the benefit from this medicine. Do not stop taking your medicine unless your doctor tells you to. Your doctor may order blood tests or other tests to see how you are doing. Women should inform their doctor if they wish to become pregnant or think they might be pregnant. There is a potential for serious side effects to an unborn child. Talk to your health care professional or pharmacist for more information. You should make sure that you get enough calcium and vitamin D while you are taking this medicine. Discuss the foods you eat and the vitamins you take with your health care professional. Some people who take this medicine have severe bone, joint, and/or muscle pain. This medicine may also increase your risk for a broken thigh bone. Tell your doctor right away if you have pain in your upper leg or groin. Tell your doctor if you have any pain that does not go away or that gets worse. What side effects may I notice from receiving this medicine? Side effects that you should report to your doctor or health care professional as soon as possible: -allergic reactions like skin rash, itching or hives, swelling of the face, lips, or tongue -anxiety, confusion, or depression -breathing problems -changes in vision -feeling faint or lightheaded, falls -jaw burning, cramping, pain -muscle cramps, stiffness, or weakness -trouble passing urine or change in the amount of urine Side effects that usually do not require medical attention (report to your doctor or health care professional if they continue or are bothersome): -bone, joint, or  muscle pain -fever -hair loss -irritation at site where injected -loss of appetite -nausea, vomiting -stomach upset -tired This list may not describe all possible side effects. Call your doctor for medical advice about side effects. You may report side effects  to FDA at 1-800-FDA-1088. Where should I keep my medicine? This drug is given in a hospital or clinic and will not be stored at home. NOTE: This sheet is a summary. It may not cover all possible information. If you have questions about this medicine, talk to your doctor, pharmacist, or health care provider.  2013, [employer]. (06/23/2010 9:06:58 AM)

## 2013-10-28 ENCOUNTER — Other Ambulatory Visit: Payer: Self-pay | Admitting: *Deleted

## 2013-10-28 DIAGNOSIS — E559 Vitamin D deficiency, unspecified: Secondary | ICD-10-CM

## 2013-10-28 DIAGNOSIS — M8580 Other specified disorders of bone density and structure, unspecified site: Secondary | ICD-10-CM

## 2013-10-28 DIAGNOSIS — Z853 Personal history of malignant neoplasm of breast: Secondary | ICD-10-CM

## 2013-10-29 ENCOUNTER — Other Ambulatory Visit (HOSPITAL_BASED_OUTPATIENT_CLINIC_OR_DEPARTMENT_OTHER): Payer: Medicare Other

## 2013-10-29 DIAGNOSIS — E559 Vitamin D deficiency, unspecified: Secondary | ICD-10-CM

## 2013-10-29 DIAGNOSIS — Z853 Personal history of malignant neoplasm of breast: Secondary | ICD-10-CM

## 2013-10-29 DIAGNOSIS — C50519 Malignant neoplasm of lower-outer quadrant of unspecified female breast: Secondary | ICD-10-CM

## 2013-10-29 DIAGNOSIS — M8580 Other specified disorders of bone density and structure, unspecified site: Secondary | ICD-10-CM

## 2013-10-29 LAB — CBC WITH DIFFERENTIAL/PLATELET
BASO%: 0.4 % (ref 0.0–2.0)
BASOS ABS: 0 10*3/uL (ref 0.0–0.1)
EOS ABS: 0.1 10*3/uL (ref 0.0–0.5)
EOS%: 2.5 % (ref 0.0–7.0)
HCT: 41 % (ref 34.8–46.6)
HGB: 13.4 g/dL (ref 11.6–15.9)
LYMPH%: 21.4 % (ref 14.0–49.7)
MCH: 30.3 pg (ref 25.1–34.0)
MCHC: 32.7 g/dL (ref 31.5–36.0)
MCV: 92.9 fL (ref 79.5–101.0)
MONO#: 0.6 10*3/uL (ref 0.1–0.9)
MONO%: 10.4 % (ref 0.0–14.0)
NEUT#: 3.7 10*3/uL (ref 1.5–6.5)
NEUT%: 65.3 % (ref 38.4–76.8)
Platelets: 233 10*3/uL (ref 145–400)
RBC: 4.42 10*6/uL (ref 3.70–5.45)
RDW: 12.9 % (ref 11.2–14.5)
WBC: 5.7 10*3/uL (ref 3.9–10.3)
lymph#: 1.2 10*3/uL (ref 0.9–3.3)

## 2013-10-29 LAB — COMPREHENSIVE METABOLIC PANEL (CC13)
ALT: 13 U/L (ref 0–55)
ANION GAP: 9 meq/L (ref 3–11)
AST: 16 U/L (ref 5–34)
Albumin: 3.7 g/dL (ref 3.5–5.0)
Alkaline Phosphatase: 94 U/L (ref 40–150)
BUN: 17.3 mg/dL (ref 7.0–26.0)
CO2: 30 mEq/L — ABNORMAL HIGH (ref 22–29)
Calcium: 10.1 mg/dL (ref 8.4–10.4)
Chloride: 100 mEq/L (ref 98–109)
Creatinine: 0.9 mg/dL (ref 0.6–1.1)
Glucose: 84 mg/dl (ref 70–140)
Potassium: 4 mEq/L (ref 3.5–5.1)
Sodium: 139 mEq/L (ref 136–145)
Total Bilirubin: 0.3 mg/dL (ref 0.20–1.20)
Total Protein: 6.4 g/dL (ref 6.4–8.3)

## 2013-11-05 ENCOUNTER — Telehealth: Payer: Self-pay | Admitting: Oncology

## 2013-11-05 ENCOUNTER — Telehealth: Payer: Self-pay | Admitting: *Deleted

## 2013-11-05 ENCOUNTER — Ambulatory Visit (HOSPITAL_BASED_OUTPATIENT_CLINIC_OR_DEPARTMENT_OTHER): Payer: Medicare Other | Admitting: Oncology

## 2013-11-05 VITALS — BP 122/57 | HR 73 | Temp 98.0°F | Resp 18 | Ht 64.0 in | Wt 145.6 lb

## 2013-11-05 DIAGNOSIS — M949 Disorder of cartilage, unspecified: Secondary | ICD-10-CM

## 2013-11-05 DIAGNOSIS — M8580 Other specified disorders of bone density and structure, unspecified site: Secondary | ICD-10-CM

## 2013-11-05 DIAGNOSIS — C50512 Malignant neoplasm of lower-outer quadrant of left female breast: Secondary | ICD-10-CM

## 2013-11-05 DIAGNOSIS — M899 Disorder of bone, unspecified: Secondary | ICD-10-CM

## 2013-11-05 NOTE — Telephone Encounter (Signed)
per pof to sch pt appt-gave pt copy of sch-pt sch mamma & DEXA

## 2013-11-05 NOTE — Addendum Note (Signed)
Addended by: Jaci Carrel A on: 11/05/2013 11:16 AM   Modules accepted: Orders

## 2013-11-05 NOTE — Telephone Encounter (Signed)
Per staff message and POF I have scheduled appts. Advised scheduler of appts. JMW  

## 2013-11-05 NOTE — Progress Notes (Signed)
ID: Catherine Carlson   DOB: 08-22-1940  MR#: [id number]  KXF#:[id number]  PCP: Catherine Robson, MD GYN:  SU:  OTHER MD: Catherine Guest MD  CHIEF COMPLAINT:  Left Breast Cancer   HISTORY OF PRESENT ILLNESS:    Catherine Carlson is a 73 year old female who early in 2003, noticed approximately a pea-sized nodule in the lower outer quadrant of her left breast.  She routinely performed self-breast examinations, and this was a new finding for her. She denies any pain in the breast area, nipple discharge or bleeding. The patient had undergone a routine screening mammogram at [facility] on April 22, 2001 which showed no suspicious areas in either breast.  The patient proceeded to be seen by Catherine Carlson, who palpated a lesion in the 4 o'clock position of the breast which was very close to the skin.  Ultrasound of this area revealed it was not a simple cyst.  In light of the above findings, the patient proceeded to undergo excisional biopsy with pathology returning invasive ductal carcinoma, Grade 2 out of 3. 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  The tumor did focally involve the superficial margin. In light of this, the patient proceeded to undergo reexcision.  In addition, at the same time, she underwent a sentinel node procedure. The patient was found to have no residual carcinoma in the reexcision.  The sentinel node specimen revealed no evidence of metastatic disease.  Her subsequent history is as detailed below   INTERVAL HISTORY: Catherine Carlson returns today for followup of her left breast cancer. The interval history is significant for having had an episode of syncope. She was evaluated by her primary care physician, who referred her initially to Catherine Carlson at [place]. She was incidentally found to be hyperthyroid and is now being followed by Catherine Carlson, who is treating her with methimazole. Catherine Carlson is tolerating that  well. She has also been started on a beta blocker for her tremors.--Daron Offer breast cancer is concerned, she continues on anastrozole with no side effects that she is aware of. She'll lipase $5 a month for the medication. The concern of courses bone thinning, but she receives zolendronate on a yearly basis. She tolerates that also without any side effects that she is aware of  REVIEW OF SYSTEMS: Catherine Carlson has had 2 falls in the last few months. This led to her son building a railing at her entrance, which is where the falls occurred. She has had no unusual headaches, visual changes, cough, phlegm production, pleurisy, shortness of breath, change in bowel or bladder habits. She has occasional back or joint pain which is intermittent, not more intense than before,. Otherwise a detailed review of systems today was entirely stable   PAST MEDICAL HISTORY: Past Medical History  Diagnosis Date  . Cancer   . Family history of breast cancer     Catherine Carlson  . Vitamin D deficiency 10/15/2011  . Osteopenia   :    The patient underwent a hysterectomy in 1973 for endometriosis.  She has a history of hypertension.  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  The patient was previously on Synthroid in the past for thyroid problems but none currently.  The patient has retained one ovary.    PAST SURGICAL HISTORY: Past Surgical History  Procedure Laterality Date  . Abdominal hysterectomy  1970  . Breast lumpectomy  06/10/2001    Left - Catherine Carlson    FAMILY HISTORY Family History  Problem Relation Age of Onset  . Heart disease Mother   . Heart disease Father   Significant for paternal grandmother with history of breast cancer, diagnosed at age 89.   There is no other family history of breast or gynecologic malignancies or other type malignancies.    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 Her husband of 67 years, Catherine Carlson, was "troubleshoot her" for a local company. They have an adopted son, Catherine Carlson, 69, who runs the patient's farm. They grow tobacco, soybean's, and week. She has 2 grandsons, age 106 and 85. The patient is a Tourist information centre manager.   ADVANCED DIRECTIVES: in place  HEALTH MAINTENANCE: (Updated 10/23/2012) History  Substance Use Topics  . Smoking status: Never Smoker   . Smokeless tobacco: Never Used  . Alcohol Use: No     Colonoscopy: 2012  PAP: Status post hysterectomy  Bone density: July 2013, osteopenia  Lipid panel: Catherine. Ronnald Carlson  Allergies  Allergen Reactions  . Morphine And Related Itching, Nausea And Vomiting and Other (See Comments)    Caused GI upset. Also caused patient to black out. Unaware of what was going on around her. Need help to move around.  . Penicillins Anaphylaxis and Hives    Hives go around the neck  . Codeine Nausea And Vomiting    Current Outpatient Prescriptions  Medication Sig Dispense Refill  . amLODipine (NORVASC) 5 MG tablet Take 5 mg by mouth daily.        Marland Kitchen anastrozole (ARIMIDEX) 1 MG tablet Take 1 tablet (1 mg total) by mouth daily.  90 tablet  3  . aspirin 81 MG tablet Take 81 mg by mouth daily.        . Bepotastine Besilate (BEPREVE) 1.5 % SOLN Place 1 drop into the left eye 2 (two) times daily.      . calcium citrate (CALCITRATE - DOSED IN MG ELEMENTAL CALCIUM) 950 MG tablet Take 1 tablet by mouth daily.        . carbamazepine (TEGRETOL) 200 MG tablet Take 200 mg by mouth 3 (three) times daily.        . cholecalciferol (VITAMIN D) 1000 UNITS tablet Take 1,000 Units by mouth daily.      Marland Kitchen escitalopram (LEXAPRO) 10 MG tablet Take 10 mg by mouth daily.      . fish oil-omega-3 fatty acids 1000 MG capsule Take 2 g by mouth daily.        . methimazole (TAPAZOLE) 5 MG tablet Take 5 mg by mouth daily.      Marland Kitchen telmisartan-hydrochlorothiazide (MICARDIS HCT) 80-25 MG per tablet Take 1 tablet by mouth daily.       No current  facility-administered medications for this visit.    OBJECTIVE: Elderly white woman in no acute distress Filed Vitals:   11/05/13 0956  BP: 122/57  Pulse: 73  Temp: 98 F (36.7 C)  Resp: 18     Body mass index is 24.98 kg/(m^2).    ECOG FS: 1 Filed Weights   11/05/13 0956  Weight: 145 lb 9.6 oz (66.044 kg)   Sclerae unicteric, EOMs intact, no nystagmus Oropharynx clear, teeth in good repair No cervical or supraclavicular adenopathy Lungs no rales or rhonchi Heart regular rate and rhythm Abd soft, nontender, positive bowel sounds MSK no focal spinal tenderness, no upper extremity lymphedema Neuro:  nonfocal, well oriented, anxious affect Breasts: The right breast is unremarkable. The left breast is status post lumpectomy and radiation. There is no evidence of local recurrence. The left axilla is benign.    LAB RESULTS: Lab Results  Component Value Date   WBC 5.7 10/29/2013   NEUTROABS 3.7 10/29/2013   HGB 13.4 10/29/2013   HCT 41.0 10/29/2013   MCV 92.9 10/29/2013   PLT 233 10/29/2013      Chemistry      Component Value Date/Time   NA 139 10/29/2013 1048   NA 131* 11/15/2010 1333   NA 131* 11/15/2010 1333   K 4.0 10/29/2013 1048   K 3.9 11/15/2010 1333   K 3.9 11/15/2010 1333   CL 98 10/15/2011 1342   CL 94* 11/15/2010 1333   CL 94* 11/15/2010 1333   CO2 30* 10/29/2013 1048   CO2 29 11/15/2010 1333   CO2 29 11/15/2010 1333   BUN 17.3 10/29/2013 1048   BUN 17 11/15/2010 1333   BUN 17 11/15/2010 1333   CREATININE 0.9 10/29/2013 1048   CREATININE 0.81 11/15/2010 1333   CREATININE 0.81 11/15/2010 1333      Component Value Date/Time   CALCIUM 10.1 10/29/2013 1048   CALCIUM 9.9 11/15/2010 1333   CALCIUM 9.9 11/15/2010 1333   ALKPHOS 94 10/29/2013 1048   ALKPHOS 101 11/15/2010 1333   ALKPHOS 101 11/15/2010 1333   AST 16 10/29/2013 1048   AST 19 11/15/2010 1333   AST 19 11/15/2010 1333   ALT 13 10/29/2013 1048   ALT 10 11/15/2010 1333   ALT 10 11/15/2010 1333   BILITOT 0.30  10/29/2013 1048   BILITOT 0.3 11/15/2010 1333   BILITOT 0.3 11/15/2010 1333       STUDIES: Mammography and repeat bone density pending   ASSESSMENT: 73 y.o. 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  Continues with good tolerance, the plan being to continue indefinitely  (5) osteopenia, currently receiving zoledronic acid annually, with first dose on 10/26/2011. Due again in October 2014    PLAN:   Stanton Kidney and I have extensively discussed the fact that we really do not have data for continuing aromatase inhibitors beyond 5 years much less 10. However this is "my security blanket". We also don't have data that we need to stop it. The main concern with these medications of course is bone thinning, but she is receiving zolendronate yearly, which she is tolerating well. I expect we will see some improvement in her bone density when she has her study next month.  Her next zolendronate I will be next week. She will see me a year from now, in mid to late November so that she can have her mammogram before the visit.  I discussed the survivorship clinic with [facility]. She is interested. Once we get that off the ground she will be wildly first patient's we will contact    Grady C    11/05/2013

## 2013-11-09 ENCOUNTER — Ambulatory Visit: Payer: Medicare Other

## 2013-11-19 ENCOUNTER — Other Ambulatory Visit: Payer: Self-pay | Admitting: *Deleted

## 2013-11-19 DIAGNOSIS — E559 Vitamin D deficiency, unspecified: Secondary | ICD-10-CM

## 2013-11-19 DIAGNOSIS — Z853 Personal history of malignant neoplasm of breast: Secondary | ICD-10-CM

## 2013-11-19 MED ORDER — ANASTROZOLE 1 MG PO TABS
1.0000 mg | ORAL_TABLET | Freq: Every day | ORAL | Status: DC
Start: 2013-11-19 — End: 2014-11-10

## 2013-11-23 ENCOUNTER — Ambulatory Visit
Admission: RE | Admit: 2013-11-23 | Discharge: 2013-11-23 | Disposition: A | Discharge status: Home / Self Care | Payer: Medicare Other | Source: Ambulatory Visit | Attending: Physician Assistant | Admitting: Physician Assistant

## 2013-11-23 DIAGNOSIS — E559 Vitamin D deficiency, unspecified: Secondary | ICD-10-CM

## 2013-11-23 DIAGNOSIS — Z780 Asymptomatic menopausal state: Secondary | ICD-10-CM

## 2013-11-23 DIAGNOSIS — M8580 Other specified disorders of bone density and structure, unspecified site: Secondary | ICD-10-CM

## 2013-11-23 DIAGNOSIS — Z853 Personal history of malignant neoplasm of breast: Secondary | ICD-10-CM

## 2013-11-23 DIAGNOSIS — Z1231 Encounter for screening mammogram for malignant neoplasm of breast: Secondary | ICD-10-CM

## 2014-03-08 ENCOUNTER — Telehealth: Payer: Self-pay | Admitting: *Deleted

## 2014-03-08 ENCOUNTER — Other Ambulatory Visit: Payer: Self-pay

## 2014-03-08 DIAGNOSIS — Z1231 Encounter for screening mammogram for malignant neoplasm of breast: Secondary | ICD-10-CM

## 2014-03-08 NOTE — Telephone Encounter (Signed)
Patient called and left message to schedule missed appt. I have called her back and scheduled appt. Patient aware of date and time.

## 2014-03-12 ENCOUNTER — Other Ambulatory Visit: Payer: Self-pay | Admitting: Oncology

## 2014-03-12 ENCOUNTER — Ambulatory Visit (HOSPITAL_BASED_OUTPATIENT_CLINIC_OR_DEPARTMENT_OTHER): Payer: Medicare Other

## 2014-03-12 ENCOUNTER — Encounter (INDEPENDENT_AMBULATORY_CARE_PROVIDER_SITE_OTHER): Payer: Self-pay

## 2014-03-12 VITALS — BP 145/65 | HR 65 | Temp 97.7°F | Resp 20

## 2014-03-12 DIAGNOSIS — C50912 Malignant neoplasm of unspecified site of left female breast: Secondary | ICD-10-CM

## 2014-03-12 DIAGNOSIS — Z853 Personal history of malignant neoplasm of breast: Secondary | ICD-10-CM

## 2014-03-12 DIAGNOSIS — C50919 Malignant neoplasm of unspecified site of unspecified female breast: Secondary | ICD-10-CM

## 2014-03-12 DIAGNOSIS — C50512 Malignant neoplasm of lower-outer quadrant of left female breast: Secondary | ICD-10-CM

## 2014-03-12 DIAGNOSIS — Z780 Asymptomatic menopausal state: Secondary | ICD-10-CM

## 2014-03-12 DIAGNOSIS — M8580 Other specified disorders of bone density and structure, unspecified site: Secondary | ICD-10-CM

## 2014-03-12 DIAGNOSIS — E559 Vitamin D deficiency, unspecified: Secondary | ICD-10-CM

## 2014-03-12 LAB — COMPREHENSIVE METABOLIC PANEL (CC13)
ALBUMIN: 3.8 g/dL (ref 3.5–5.0)
ALT: 9 U/L (ref 0–55)
AST: 16 U/L (ref 5–34)
Alkaline Phosphatase: 98 U/L (ref 40–150)
Anion Gap: 10 mEq/L (ref 3–11)
BUN: 16.1 mg/dL (ref 7.0–26.0)
CALCIUM: 9.6 mg/dL (ref 8.4–10.4)
CHLORIDE: 98 meq/L (ref 98–109)
CO2: 28 meq/L (ref 22–29)
CREATININE: 0.9 mg/dL (ref 0.6–1.1)
EGFR: 65 mL/min/{1.73_m2} — ABNORMAL LOW (ref >90)
Glucose: 77 mg/dl (ref 70–140)
Potassium: 3.8 mEq/L (ref 3.5–5.1)
Sodium: 136 mEq/L (ref 136–145)
Total Bilirubin: 0.28 mg/dL (ref 0.20–1.20)
Total Protein: 6.4 g/dL (ref 6.4–8.3)

## 2014-03-12 LAB — CBC WITH DIFFERENTIAL/PLATELET
BASO%: 0.2 % (ref 0.0–2.0)
BASOS ABS: 0 10*3/uL (ref 0.0–0.1)
EOS%: 2.5 % (ref 0.0–7.0)
Eosinophils Absolute: 0.2 10*3/uL (ref 0.0–0.5)
HCT: 41 % (ref 34.8–46.6)
HEMOGLOBIN: 13.5 g/dL (ref 11.6–15.9)
LYMPH%: 18 % (ref 14.0–49.7)
MCH: 30.8 pg (ref 25.1–34.0)
MCHC: 32.9 g/dL (ref 31.5–36.0)
MCV: 93.6 fL (ref 79.5–101.0)
MONO#: 0.7 10*3/uL (ref 0.1–0.9)
MONO%: 11 % (ref 0.0–14.0)
NEUT%: 68.3 % (ref 38.4–76.8)
NEUTROS ABS: 4.1 10*3/uL (ref 1.5–6.5)
PLATELETS: 191 10*3/uL (ref 145–400)
RBC: 4.38 10*6/uL (ref 3.70–5.45)
RDW: 12.8 % (ref 11.2–14.5)
WBC: 6 10*3/uL (ref 3.9–10.3)
lymph#: 1.1 10*3/uL (ref 0.9–3.3)

## 2014-03-12 MED ORDER — ZOLEDRONIC ACID 4 MG/100ML IV SOLN
4.0000 mg | Freq: Once | INTRAVENOUS | Status: AC
  Administered 2014-03-12: 4 mg via INTRAVENOUS
  Filled 2014-03-12: qty 100

## 2014-03-12 MED ORDER — SODIUM CHLORIDE 0.9 % IV SOLN
Freq: Once | INTRAVENOUS | Status: AC
  Administered 2014-03-12: 11:00:00 via INTRAVENOUS

## 2014-03-12 NOTE — Patient Instructions (Signed)

## 2014-03-13 LAB — VITAMIN D 25 HYDROXY (VIT D DEFICIENCY, FRACTURES): VIT D 25 HYDROXY: 8 ng/mL — AB (ref 30–100)

## 2014-03-15 ENCOUNTER — Other Ambulatory Visit: Payer: Self-pay | Admitting: Oncology

## 2014-03-15 ENCOUNTER — Encounter: Payer: Self-pay | Admitting: Oncology

## 2014-05-02 DIAGNOSIS — E0500 Thyrotoxicosis with diffuse goiter without thyrotoxic crisis or storm: Secondary | ICD-10-CM | POA: Insufficient documentation

## 2014-11-10 ENCOUNTER — Other Ambulatory Visit: Payer: Self-pay | Admitting: Oncology

## 2014-11-25 ENCOUNTER — Ambulatory Visit
Admission: RE | Admit: 2014-11-25 | Discharge: 2014-11-25 | Disposition: A | Discharge status: Home / Self Care | Payer: Medicare Other | Source: Ambulatory Visit

## 2014-11-25 DIAGNOSIS — Z1231 Encounter for screening mammogram for malignant neoplasm of breast: Secondary | ICD-10-CM

## 2014-11-29 ENCOUNTER — Other Ambulatory Visit (HOSPITAL_BASED_OUTPATIENT_CLINIC_OR_DEPARTMENT_OTHER): Payer: Medicare Other

## 2014-11-29 DIAGNOSIS — C50919 Malignant neoplasm of unspecified site of unspecified female breast: Secondary | ICD-10-CM | POA: Diagnosis present

## 2014-11-29 LAB — COMPREHENSIVE METABOLIC PANEL (CC13)
ALT: 13 U/L (ref 0–55)
ANION GAP: 7 meq/L (ref 3–11)
AST: 17 U/L (ref 5–34)
Albumin: 4 g/dL (ref 3.5–5.0)
Alkaline Phosphatase: 98 U/L (ref 40–150)
BUN: 17 mg/dL (ref 7.0–26.0)
CALCIUM: 9.6 mg/dL (ref 8.4–10.4)
CHLORIDE: 97 meq/L — AB (ref 98–109)
CO2: 29 mEq/L (ref 22–29)
Creatinine: 0.8 mg/dL (ref 0.6–1.1)
EGFR: 68 mL/min/{1.73_m2} — ABNORMAL LOW (ref >90)
Glucose: 104 mg/dl (ref 70–140)
Potassium: 4.4 mEq/L (ref 3.5–5.1)
Sodium: 133 mEq/L — ABNORMAL LOW (ref 136–145)
Total Bilirubin: 0.35 mg/dL (ref 0.20–1.20)
Total Protein: 6.4 g/dL (ref 6.4–8.3)

## 2014-12-06 ENCOUNTER — Ambulatory Visit (HOSPITAL_BASED_OUTPATIENT_CLINIC_OR_DEPARTMENT_OTHER): Payer: Medicare Other | Admitting: Oncology

## 2014-12-06 ENCOUNTER — Telehealth: Payer: Self-pay | Admitting: Oncology

## 2014-12-06 VITALS — BP 128/73 | HR 51 | Temp 97.5°F | Resp 18 | Ht 64.0 in | Wt 152.5 lb

## 2014-12-06 DIAGNOSIS — C50512 Malignant neoplasm of lower-outer quadrant of left female breast: Secondary | ICD-10-CM

## 2014-12-06 DIAGNOSIS — Z170 Estrogen receptor positive status [ER+]: Secondary | ICD-10-CM

## 2014-12-06 DIAGNOSIS — M8580 Other specified disorders of bone density and structure, unspecified site: Secondary | ICD-10-CM | POA: Diagnosis not present

## 2014-12-06 MED ORDER — ANASTROZOLE 1 MG PO TABS: 1.0000 mg | ORAL_TABLET | Freq: Every day | ORAL | Status: DC

## 2014-12-06 NOTE — Progress Notes (Signed)
ID: DECHELLE ATTAWAY   DOB: [birth year]  MR#: [id number]  ACZ#:[id number]  PCP: Birder Robson, MD GYN:  SU:  OTHER MD: Lind Guest MD, Frazier Richards MD  CHIEF COMPLAINT:  Left Breast Cancer  CURRENT TREATMENT: Anastrozole   HISTORY OF PRESENT ILLNESS:    From the original intake note:    Mrs. Stakes is a 74 year old female who early in 2003, noticed approximately a pea-sized nodule in the lower outer quadrant of her left breast.  She routinely performed self-breast examinations, and this was a new finding for her. She denies any pain in the breast area, nipple discharge or bleeding. The patient had undergone a routine screening mammogram at [facility] on April 22, 2001 which showed no suspicious areas in either breast.  The patient proceeded to be seen by Dr. March Rummage, who palpated a lesion in the 4 o'clock position of the breast which was very close to the skin.  Ultrasound of this area revealed it was not a simple cyst.  In light of the above findings, the patient proceeded to undergo excisional biopsy with pathology returning invasive ductal carcinoma, Grade 2 out of 3. 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  The tumor did focally involve the superficial margin. In light of this, the patient proceeded to undergo reexcision.  In addition, at the same time, she underwent a sentinel node procedure. The patient was found to have no residual carcinoma in the reexcision.  The sentinel node specimen revealed no evidence of metastatic disease.  Her subsequent history is as detailed below   INTERVAL HISTORY: Millianna returns today for followup of her left breast cancer. Since her last visit here she underwent a total thyroidectomy at [facility] on 04/28/2014. The pathology was benign (pasted below). She also had a fall, injuring her face but without fracture, though with significant bruising of her left  face. Finally [facility] is following a 4 mm right middle lobe nodule, with a CT of the chest from [facility] obtained 11/16/2014 also copied below, showing stability.  Daliya continues on anastrozole. She obtains it for approximately $3 a month. She reports no side effects from this medication.  REVIEW OF SYSTEMS: Aside from the issues noted above a detailed review of systems today was stable  PAST MEDICAL HISTORY: Past Medical History  Diagnosis Date  . Cancer   . Family history of breast cancer     PGM  . Vitamin D deficiency 10/15/2011  . Osteopenia   :    The patient underwent a hysterectomy in 1973 for endometriosis.  She has a history of hypertension.  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  The patient was previously on Synthroid in the past for thyroid problems but none currently.  The patient has retained one ovary.    PAST SURGICAL HISTORY: Past Surgical History  Procedure Laterality Date  . Abdominal hysterectomy  1970  . Breast lumpectomy  06/10/2001    Left - Dr Mertha Finders    FAMILY HISTORY Family History  Problem Relation Age of Onset  . Heart disease Mother   . Heart disease Father   Significant for paternal grandmother with history of breast cancer, diagnosed at age 7.   There is no other family history of breast or gynecologic malignancies or other type malignancies.    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 Her husband of 36 years, [name], was "troubleshoot her" for a local company. They have an adopted son, Ron, 24, who runs the patient's farm. They grow tobacco, soybean's, and week. She has 2 grandsons, age 66 and 35. The patient is a Tourist information centre manager.   ADVANCED DIRECTIVES: in place  HEALTH MAINTENANCE: (Updated 10/23/2012) Social History  Substance Use Topics  . Smoking status: Never Smoker   . Smokeless tobacco: Never Used  . Alcohol  Use: No     Colonoscopy: 2012/[facility]  PAP: Status post hysterectomy  Bone density: July 2013, osteopenia  Lipid panel: Dr. Ronnald Ramp  Allergies  Allergen Reactions  . Morphine And Related Itching, Nausea And Vomiting and Other (See Comments)    Caused GI upset. Also caused patient to black out. Unaware of what was going on around her. Need help to move around.  . Penicillins Anaphylaxis and Hives    Hives go around the neck  . Codeine Nausea And Vomiting    Current Outpatient Prescriptions  Medication Sig Dispense Refill  . amLODipine (NORVASC) 5 MG tablet Take 5 mg by mouth daily.      Marland Kitchen anastrozole (ARIMIDEX) 1 MG tablet TAKE 1 TABLET BY MOUTH ONCE DAILY. 90 tablet 0  . aspirin 81 MG tablet Take 81 mg by mouth daily.      . calcium citrate (CALCITRATE - DOSED IN MG ELEMENTAL CALCIUM) 950 MG tablet Take 1 tablet by mouth daily.      . carbamazepine (TEGRETOL) 200 MG tablet Take 200 mg by mouth 3 (three) times daily.      . cholecalciferol (VITAMIN D) 1000 UNITS tablet Take 1,000 Units by mouth daily.    Marland Kitchen escitalopram (LEXAPRO) 10 MG tablet Take 10 mg by mouth daily.    . fish oil-omega-3 fatty acids 1000 MG capsule Take 2 g by mouth daily.      . methimazole (TAPAZOLE) 5 MG tablet Take 5 mg by mouth daily.    Marland Kitchen telmisartan-hydrochlorothiazide (MICARDIS HCT) 80-25 MG per tablet Take 1 tablet by mouth daily.     No current facility-administered medications for this visit.    OBJECTIVE: Elderly white womanwho appears stated age 74 Vitals:   12/06/14 1323  BP: 128/73  Pulse: 51  Temp: 97.5 F (36.4 C)  Resp: 18     Body mass index is 26.16 kg/(m^2).    ECOG FS: 1 Filed Weights   12/06/14 1323  Weight: 152 lb 8 oz (69.174 kg)   Sclerae unicteric, pupils round and equal Oropharynx clear and moist-- no thrush or other lesions No cervical or supraclavicular adenopathy Lungs no rales or rhonchi Heart regular rate and rhythm Abd soft, nontender, positive bowel sounds MSK no  focal spinal tenderness, no upper extremity lymphedema Neuro: nonfocal, well oriented, appropriate affect Breasts: the right breast is unremarkable. The left breast is status post lumpectomy and radiation. There is no evidence of local recurrence. The left axilla is benign.    LAB RESULTS: Lab Results  Component Value Date   WBC 6.0 03/12/2014   NEUTROABS 4.1 03/12/2014   HGB 13.5 03/12/2014   HCT 41.0 03/12/2014   MCV 93.6 03/12/2014   PLT 191 03/12/2014      Chemistry      Component Value Date/Time   NA 133* 11/29/2014 0925   NA 131* 11/15/2010 1333   NA 131* 11/15/2010 1333   K 4.4 11/29/2014 0925   K 3.9 11/15/2010 1333   K  3.9 11/15/2010 1333   CL 98 10/15/2011 1342   CL 94* 11/15/2010 1333   CL 94* 11/15/2010 1333   CO2 29 11/29/2014 0925   CO2 29 11/15/2010 1333   CO2 29 11/15/2010 1333   BUN 17.0 11/29/2014 0925   BUN 17 11/15/2010 1333   BUN 17 11/15/2010 1333   CREATININE 0.8 11/29/2014 0925   CREATININE 0.81 11/15/2010 1333   CREATININE 0.81 11/15/2010 1333      Component Value Date/Time   CALCIUM 9.6 11/29/2014 0925   CALCIUM 9.9 11/15/2010 1333   CALCIUM 9.9 11/15/2010 1333   ALKPHOS 98 11/29/2014 0925   ALKPHOS 101 11/15/2010 1333   ALKPHOS 101 11/15/2010 1333   AST 17 11/29/2014 0925   AST 19 11/15/2010 1333   AST 19 11/15/2010 1333   ALT 13 11/29/2014 0925   ALT 10 11/15/2010 1333   ALT 10 11/15/2010 1333   BILITOT 0.35 11/29/2014 0925   BILITOT 0.3 11/15/2010 1333   BILITOT 0.3 11/15/2010 1333       STUDIES: Mammography and repeat bone density pending  Cytology, Non GYN Fine Needle Aspiration Biopsy (04/14/2014 1:10 PM) Cytology, Non GYN Fine Needle Aspiration Biopsy (04/14/2014 1:10 PM)  Component Value Ref Range  Case Report Fine Needle AspirateCase: [id number]  Authorizing Provider:Bhavik Posey Pronto, MD Collected: 04/14/2014 1310 Ordering  [facility]2014 [place]  Specimen:Thyroid, RT THYROID MID/POST 6     Specimen Source A Thyroid, right, mid/posterior #6, ultrasound-guided fine needle aspiration biopsy   Diagnostic Interpretation A NEGATIVE. NO EVIDENCE OF MALIGNANCY.    Scant follicular epithelium in a degenerated/cystic background.    Procedure Notes Ultrasound guided fine needle aspiration biopsy of right thyroid mid/ post 6performed by Dr. Posey Pronto, radiologist.    Clinical Information 74 year old female with history of hyperthyroidism and multiple thyroid nodules.   Material Submitted 9 mls clear fluid received in normosol. 2 alcohol fixed smear(s) and 2 air dried smear(s) prepared. 1 ThinPrep slide prepared.    Immediate Assessment A Adequate (by Retta Mac).     Pathology - General / Other (04/28/2014 10:35 AM) Pathology - General / Other (04/28/2014 10:35 AM)  Component Value Ref Range  Case Report Surgical PathologyCase: [id number]  Authorizing Provider:Sanziana Cristy Folks, MD Collected: 04/28/2014 1035 Ordering Location: [facility] PeriopReceived:04/28/2014 [place] Pathologist: Samantha Crimes,  MD  Specimen:Thyroid, Total Thyroid Double Right Upper Single Left Lower    DIAGNOSIS A. Thyroid, total thyroidectomy:  Benign thyroid tissue with nodular hyperplasia and FNA site changes. One benign lymph node identified (0/1    Result Narrative  CT Chest without  contrast  Indication: R91.1 Solitary pulmonary nodule, RML Nodule  Compare: 07/30/2014  Technique:  Volumetric non-contrast chest CT acquisition was performed from the lower neck to the adrenal glands.  1.25 mm axial, 5 mm axial, 3 mm coronal, and 3 mm sagittal reconstructions were performed. 3D axial maximum intensity projection images (MIPS) were reconstructed to facilitate lung nodule detection.   Findings:   The visualized thyroid gland and neck base are unremarkable. The heart is normal in size with no pericardial effusion. Thoracic aorta is normal in course and caliber. Normal caliber pulmonary artery. Significant mitral annular calcifications.  There is no supraclavicular or axillary lymphadenopathy.   There is no mediastinal lymphadenopathy.  Left axillary surgical clips.  The central airways are patent. Minimal distal mucous plugging in the right lower lobe.  Right upper lobe subpleural opacity (series 301 image 72), likely pleural parenchymal scarring. Stable  4 mm right middle lobe nodule, not significantly changed given differences in technique. Bandlike opacity in the anterior right middle lobe with mildly nodular component inferiorly, which is favored to represent scarring or focal atelectasis. Subpleural reticular opacities, likely atelectasis. Scarring around osteophyte in the right lower lobe.   Visualized portions of the upper abdomen are unremarkable. Small hiatal hernia.  No acute or aggressive appearing osseous lesions. Multiple degenerative changes of the spine.  Impression:  No significant change in 4 mm right middle lobe nodule not significantly changed given differences in technique. If patient has risk factors for lung cancer, recommend 12 month follow up CT.   The preliminary report (critical or emergent communication) was reviewed prior to this dictation and there are no substantial differences between the preliminary results and the  impressions in this final report.  Electronically Reviewed by:  Karolee Stamps, MD Electronically Reviewed on:  11/17/2014 10:16 AM  I have reviewed the images and concur with the above findings.  Electronically Signed by:  Waynard Edwards, MD Electronically Signed on:  11/20/2014 5:41 PM   Status Results Details   Encounter Summary   CLINICAL DATA: Postmenopausal. Patient is currently being treated with Reclast.  EXAM: DUAL X-RAY ABSORPTIOMETRY (DXA) FOR BONE MINERAL DENSITY  FINDINGS: AP LUMBAR SPINE (L1-L4)  Bone Mineral Density (BMD): 0.863 g/cm2  Young Adult T-Score: -1.7  Z-Score: 0.7  LEFT FEMUR NECK  Bone Mineral Density (BMD): 0.720 g/cm2  Young Adult T-Score: -1.2  Z-Score: -0.9  ASSESSMENT: Patient's diagnostic category is LOW BONE MASS by WHO Criteria.  FRACTURE RISK: UNKNOWN IN THIS PATIENT TAKING RECAST.  FRAX: World Health Organization FRAX assessment of absolute fracture risk is not calculated for this patient because the patient has a history of bone building therapy.  COMPARISON: The bone mineral density of the lumbar spine has decreased by 3.5% and the bone mineral density of the left hip has not significantly changed compared to the baseline study dated 11/19/2000. The bone mineral density of the lumbar spine has increased by 4.6% and the bone mineral density of the left hip has increased by 3.4% compared to the prior exam dated 07/30/2011.  Mm Screening Breast Tomo Bilateral  11/26/2014  CLINICAL DATA:  Screening. EXAM: DIGITAL SCREENING BILATERAL MAMMOGRAM WITH 3D TOMO WITH CAD COMPARISON:  Previous exam(s). ACR Breast Density Category d: The breast tissue is extremely dense, which lowers the sensitivity of mammography. FINDINGS: There are no findings suspicious for malignancy. Images were processed with CAD. IMPRESSION: No mammographic evidence of malignancy. A result letter of this screening mammogram will be mailed  directly to the patient. RECOMMENDATION: Screening mammogram in one year. (Code:SM-B-01Y) BI-RADS CATEGORY  1: Negative. Electronically Signed   By: Abelardo Diesel M.D.   On: 11/26/2014 10:40    ASSESSMENT: 74 y.o. 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  Continues with good tolerance, the plan being to continue indefinitely  (5) osteopenia, currently receiving zoledronic acid annually, with first dose on 10/26/2011, most recent dfose 03/12/2014   PLAN:   Fedora is doing fine from a breast cancer point of view. She understands we do not have data to continue anastrozole beyond 10 years but her plan is to  continue indefinitely. She obtains it under very good price and has no side effects from it. Accordingly the only real concern is osteopenia. We are treating that with zolendronate yearly and she will have her next dose March 2017.  We again discussed the survivorship program which is now in place. She will see our survivorship next practitioner next year and then she will return to see me 2 years from now.  We discussed falls and fall prevention in detail today  Philis knows to call for any problems that may develop before her next visit here.    Zigmond Trela C    12/06/2014

## 2014-12-06 NOTE — Telephone Encounter (Signed)
gave and pritnd appt sched and avs for pt for March and NOV

## 2015-03-21 ENCOUNTER — Telehealth: Payer: Self-pay | Admitting: Oncology

## 2015-03-21 NOTE — Telephone Encounter (Signed)
Patient called to reschedule 3/15 appointment to 3/22 due to she has an infection and is on antibiotics. Patient has new date/time for 3/22 @ 9:30 am.

## 2015-03-23 ENCOUNTER — Ambulatory Visit: Payer: Medicare Other

## 2015-03-29 ENCOUNTER — Other Ambulatory Visit: Payer: Self-pay | Admitting: Hematology and Oncology

## 2015-03-29 ENCOUNTER — Other Ambulatory Visit: Payer: Self-pay | Admitting: *Deleted

## 2015-03-29 DIAGNOSIS — C50512 Malignant neoplasm of lower-outer quadrant of left female breast: Secondary | ICD-10-CM

## 2015-03-30 ENCOUNTER — Other Ambulatory Visit (HOSPITAL_BASED_OUTPATIENT_CLINIC_OR_DEPARTMENT_OTHER): Payer: Medicare Other

## 2015-03-30 ENCOUNTER — Ambulatory Visit (HOSPITAL_BASED_OUTPATIENT_CLINIC_OR_DEPARTMENT_OTHER): Payer: Medicare Other

## 2015-03-30 VITALS — BP 119/55 | HR 53 | Temp 97.7°F | Resp 18

## 2015-03-30 DIAGNOSIS — Z853 Personal history of malignant neoplasm of breast: Secondary | ICD-10-CM

## 2015-03-30 DIAGNOSIS — Z780 Asymptomatic menopausal state: Secondary | ICD-10-CM

## 2015-03-30 DIAGNOSIS — M8580 Other specified disorders of bone density and structure, unspecified site: Secondary | ICD-10-CM | POA: Diagnosis present

## 2015-03-30 DIAGNOSIS — C50512 Malignant neoplasm of lower-outer quadrant of left female breast: Secondary | ICD-10-CM

## 2015-03-30 DIAGNOSIS — E559 Vitamin D deficiency, unspecified: Secondary | ICD-10-CM

## 2015-03-30 LAB — COMPREHENSIVE METABOLIC PANEL
ALBUMIN: 3.7 g/dL (ref 3.5–5.0)
ALK PHOS: 68 U/L (ref 40–150)
ANION GAP: 9 meq/L (ref 3–11)
AST: 12 U/L (ref 5–34)
BILIRUBIN TOTAL: 0.32 mg/dL (ref 0.20–1.20)
BUN: 20.3 mg/dL (ref 7.0–26.0)
CALCIUM: 9.1 mg/dL (ref 8.4–10.4)
CHLORIDE: 104 meq/L (ref 98–109)
CO2: 29 mEq/L (ref 22–29)
CREATININE: 1 mg/dL (ref 0.6–1.1)
EGFR: 53 mL/min/{1.73_m2} — ABNORMAL LOW (ref >90)
Glucose: 98 mg/dl (ref 70–140)
Potassium: 3.5 mEq/L (ref 3.5–5.1)
Sodium: 141 mEq/L (ref 136–145)
Total Protein: 6.3 g/dL — ABNORMAL LOW (ref 6.4–8.3)

## 2015-03-30 MED ORDER — SODIUM CHLORIDE 0.9 % IJ SOLN
3.0000 mL | Freq: Once | INTRAMUSCULAR | Status: DC | PRN
  Filled 2015-03-30: qty 10

## 2015-03-30 MED ORDER — SODIUM CHLORIDE 0.9 % IJ SOLN
10.0000 mL | INTRAMUSCULAR | Status: DC | PRN
  Filled 2015-03-30: qty 10

## 2015-03-30 MED ORDER — ZOLEDRONIC ACID 4 MG/100ML IV SOLN
4.0000 mg | Freq: Once | INTRAVENOUS | Status: AC
  Administered 2015-03-30: 4 mg via INTRAVENOUS
  Filled 2015-03-30: qty 100

## 2015-03-30 MED ORDER — HEPARIN SOD (PORK) LOCK FLUSH 100 UNIT/ML IV SOLN
500.0000 [IU] | Freq: Once | INTRAVENOUS | Status: DC | PRN
  Filled 2015-03-30: qty 5

## 2015-03-30 MED ORDER — HEPARIN SOD (PORK) LOCK FLUSH 100 UNIT/ML IV SOLN
250.0000 [IU] | Freq: Once | INTRAVENOUS | Status: DC | PRN
  Filled 2015-03-30: qty 5

## 2015-03-30 MED ORDER — SODIUM CHLORIDE 0.9 % IV SOLN
Freq: Once | INTRAVENOUS | Status: AC
  Administered 2015-03-30: 10:00:00 via INTRAVENOUS

## 2015-03-30 MED ORDER — ALTEPLASE 2 MG IJ SOLR
2.0000 mg | Freq: Once | INTRAMUSCULAR | Status: DC | PRN
  Filled 2015-03-30: qty 2

## 2015-03-30 NOTE — Patient Instructions (Signed)
[facility] Discharge Instructions for Patients Receiving Chemotherapy  Today you received the following chemotherapy agent: Zometa.  To help prevent nausea and vomiting after your treatment, we encourage you to take your nausea medication as prescribed.   If you develop nausea and vomiting that is not controlled by your nausea medication, call the clinic.   BELOW ARE SYMPTOMS THAT SHOULD BE REPORTED IMMEDIATELY:  *FEVER GREATER THAN 100.5 F  *CHILLS WITH OR WITHOUT FEVER  NAUSEA AND VOMITING THAT IS NOT CONTROLLED WITH YOUR NAUSEA MEDICATION  *UNUSUAL SHORTNESS OF BREATH  *UNUSUAL BRUISING OR BLEEDING  TENDERNESS IN MOUTH AND THROAT WITH OR WITHOUT PRESENCE OF ULCERS  *URINARY PROBLEMS  *BOWEL PROBLEMS  UNUSUAL RASH Items with * indicate a potential emergency and should be followed up as soon as possible.  Feel free to call the clinic you have any questions or concerns. The clinic phone number is [phone].  Please show the [facility] at check-in to the Emergency Department and triage nurse.    Zoledronic Acid injection (Hypercalcemia, Oncology) What is this medicine? ZOLEDRONIC ACID (ZOE le dron ik AS id) lowers the amount of calcium loss from bone. It is used to treat too much calcium in your blood from cancer. It is also used to prevent complications of cancer that has spread to the bone. This medicine may be used for other purposes; ask your health care provider or pharmacist if you have questions. What should I tell my health care provider before I take this medicine? They need to know if you have any of these conditions: -aspirin-sensitive asthma -cancer, especially if you are receiving medicines used to treat cancer -dental disease or wear dentures -infection -kidney disease -receiving corticosteroids like dexamethasone or prednisone -an unusual or allergic reaction to zoledronic acid, other medicines, foods, dyes, or  preservatives -pregnant or trying to get pregnant -breast-feeding How should I use this medicine? This medicine is for infusion into a vein. It is given by a health care professional in a hospital or clinic setting. Talk to your pediatrician regarding the use of this medicine in children. Special care may be needed. Overdosage: If you think you have taken too much of this medicine contact a poison control center or emergency room at once. NOTE: This medicine is only for you. Do not share this medicine with others. What if I miss a dose? It is important not to miss your dose. Call your doctor or health care professional if you are unable to keep an appointment. What may interact with this medicine? -certain antibiotics given by injection -NSAIDs, medicines for pain and inflammation, like ibuprofen or naproxen -some diuretics like bumetanide, furosemide -teriparatide -thalidomide This list may not describe all possible interactions. Give your health care provider a list of all the medicines, herbs, non-prescription drugs, or dietary supplements you use. Also tell them if you smoke, drink alcohol, or use illegal drugs. Some items may interact with your medicine. What should I watch for while using this medicine? Visit your doctor or health care professional for regular checkups. It may be some time before you see the benefit from this medicine. Do not stop taking your medicine unless your doctor tells you to. Your doctor may order blood tests or other tests to see how you are doing. Women should inform their doctor if they wish to become pregnant or think they might be pregnant. There is a potential for serious side effects to an unborn child. Talk to your health care  professional or pharmacist for more information. You should make sure that you get enough calcium and vitamin D while you are taking this medicine. Discuss the foods you eat and the vitamins you take with your health care  professional. Some people who take this medicine have severe bone, joint, and/or muscle pain. This medicine may also increase your risk for jaw problems or a broken thigh bone. Tell your doctor right away if you have severe pain in your jaw, bones, joints, or muscles. Tell your doctor if you have any pain that does not go away or that gets worse. Tell your dentist and dental surgeon that you are taking this medicine. You should not have major dental surgery while on this medicine. See your dentist to have a dental exam and fix any dental problems before starting this medicine. Take good care of your teeth while on this medicine. Make sure you see your dentist for regular follow-up appointments. What side effects may I notice from receiving this medicine? Side effects that you should report to your doctor or health care professional as soon as possible: -allergic reactions like skin rash, itching or hives, swelling of the face, lips, or tongue -anxiety, confusion, or depression -breathing problems -changes in vision -eye pain -feeling faint or lightheaded, falls -jaw pain, especially after dental work -mouth sores -muscle cramps, stiffness, or weakness -redness, blistering, peeling or loosening of the skin, including inside the mouth -trouble passing urine or change in the amount of urine Side effects that usually do not require medical attention (report to your doctor or health care professional if they continue or are bothersome): -bone, joint, or muscle pain -constipation -diarrhea -fever -hair loss -irritation at site where injected -loss of appetite -nausea, vomiting -stomach upset -trouble sleeping -trouble swallowing -weak or tired This list may not describe all possible side effects. Call your doctor for medical advice about side effects. You may report side effects to FDA at 1-800-FDA-1088. Where should I keep my medicine? This drug is given in a hospital or clinic and will not  be stored at home. NOTE: This sheet is a summary. It may not cover all possible information. If you have questions about this medicine, talk to your doctor, pharmacist, or health care provider.    2016, Elsevier/Gold Standard. (2013-05-23 14:19:39)

## 2015-07-21 ENCOUNTER — Encounter: Payer: Self-pay | Admitting: *Deleted

## 2015-07-28 ENCOUNTER — Ambulatory Visit
Admission: RE | Admit: 2015-07-28 | Discharge: 2015-07-28 | Disposition: A | Discharge status: Home / Self Care | Payer: Medicare Other | Source: Ambulatory Visit | Attending: Ophthalmology | Admitting: Ophthalmology

## 2015-07-28 ENCOUNTER — Encounter: Payer: Self-pay | Admitting: *Deleted

## 2015-07-28 ENCOUNTER — Encounter
Admission: RE | Disposition: A | Discharge status: Home / Self Care | Payer: Self-pay | Source: Ambulatory Visit | Attending: Ophthalmology

## 2015-07-28 ENCOUNTER — Ambulatory Visit: Payer: Medicare Other | Admitting: Anesthesiology

## 2015-07-28 DIAGNOSIS — H2512 Age-related nuclear cataract, left eye: Secondary | ICD-10-CM | POA: Insufficient documentation

## 2015-07-28 DIAGNOSIS — Z880 Allergy status to penicillin: Secondary | ICD-10-CM | POA: Diagnosis not present

## 2015-07-28 DIAGNOSIS — I10 Essential (primary) hypertension: Secondary | ICD-10-CM | POA: Insufficient documentation

## 2015-07-28 DIAGNOSIS — Z9841 Cataract extraction status, right eye: Secondary | ICD-10-CM | POA: Diagnosis not present

## 2015-07-28 DIAGNOSIS — E669 Obesity, unspecified: Secondary | ICD-10-CM | POA: Insufficient documentation

## 2015-07-28 DIAGNOSIS — M810 Age-related osteoporosis without current pathological fracture: Secondary | ICD-10-CM | POA: Diagnosis not present

## 2015-07-28 DIAGNOSIS — E079 Disorder of thyroid, unspecified: Secondary | ICD-10-CM | POA: Insufficient documentation

## 2015-07-28 DIAGNOSIS — R251 Tremor, unspecified: Secondary | ICD-10-CM | POA: Diagnosis not present

## 2015-07-28 DIAGNOSIS — Z853 Personal history of malignant neoplasm of breast: Secondary | ICD-10-CM | POA: Insufficient documentation

## 2015-07-28 DIAGNOSIS — E890 Postprocedural hypothyroidism: Secondary | ICD-10-CM | POA: Diagnosis not present

## 2015-07-28 DIAGNOSIS — Z90710 Acquired absence of both cervix and uterus: Secondary | ICD-10-CM | POA: Insufficient documentation

## 2015-07-28 DIAGNOSIS — Z885 Allergy status to narcotic agent status: Secondary | ICD-10-CM | POA: Diagnosis not present

## 2015-07-28 HISTORY — PX: CATARACT EXTRACTION W/PHACO: SHX586

## 2015-07-28 HISTORY — DX: Essential (primary) hypertension: I10

## 2015-07-28 HISTORY — DX: Unspecified convulsions: R56.9

## 2015-07-28 HISTORY — DX: Hypothyroidism, unspecified: E03.9

## 2015-07-28 HISTORY — DX: Tremor, unspecified: R25.1

## 2015-07-28 SURGERY — PHACOEMULSIFICATION, CATARACT, WITH IOL INSERTION
Anesthesia: Monitor Anesthesia Care | Site: Eye | Laterality: Left | Wound class: Clean

## 2015-07-28 MED ORDER — SODIUM HYALURONATE 10 MG/ML IO SOLN
INTRAOCULAR | Status: DC | PRN
  Administered 2015-07-28: 0.85 mL via INTRAOCULAR

## 2015-07-28 MED ORDER — SODIUM CHLORIDE 0.9 % IV SOLN
INTRAVENOUS | Status: DC
  Administered 2015-07-28: 08:00:00 via INTRAVENOUS

## 2015-07-28 MED ORDER — MIDAZOLAM HCL 2 MG/2ML IJ SOLN
INTRAMUSCULAR | Status: DC | PRN
  Administered 2015-07-28: 1 mg via INTRAVENOUS

## 2015-07-28 MED ORDER — TETRACAINE HCL 0.5 % OP SOLN
OPHTHALMIC | Status: AC
  Filled 2015-07-28: qty 2

## 2015-07-28 MED ORDER — ARMC OPHTHALMIC DILATING GEL
OPHTHALMIC | Status: AC
  Administered 2015-07-28: 1 via OPHTHALMIC
  Filled 2015-07-28: qty 0.25

## 2015-07-28 MED ORDER — POVIDONE-IODINE 5 % OP SOLN
1.0000 "application " | Freq: Once | OPHTHALMIC | Status: AC
  Administered 2015-07-28: 1 via OPHTHALMIC

## 2015-07-28 MED ORDER — TETRACAINE HCL 0.5 % OP SOLN
1.0000 [drp] | Freq: Once | OPHTHALMIC | Status: AC
  Administered 2015-07-28: 1 [drp] via OPHTHALMIC

## 2015-07-28 MED ORDER — MOXIFLOXACIN HCL 0.5 % OP SOLN
1.0000 [drp] | OPHTHALMIC | Status: DC | PRN
Start: 2015-07-28 — End: 2015-07-28

## 2015-07-28 MED ORDER — EPINEPHRINE HCL 1 MG/ML IJ SOLN
INTRAMUSCULAR | Status: AC
  Filled 2015-07-28: qty 2

## 2015-07-28 MED ORDER — SODIUM HYALURONATE 23 MG/ML IO SOLN
INTRAOCULAR | Status: DC | PRN
Start: 2015-07-28 — End: 2015-07-28
  Administered 2015-07-28: 0.6 mL via INTRAOCULAR

## 2015-07-28 MED ORDER — SODIUM HYALURONATE 23 MG/ML IO SOLN
INTRAOCULAR | Status: AC
  Filled 2015-07-28: qty 0.6

## 2015-07-28 MED ORDER — LIDOCAINE HCL (PF) 4 % IJ SOLN
INTRAMUSCULAR | Status: AC
  Filled 2015-07-28: qty 5

## 2015-07-28 MED ORDER — ARMC OPHTHALMIC DILATING GEL
1.0000 "application " | OPHTHALMIC | Status: AC | PRN
  Administered 2015-07-28 (×2): 1 via OPHTHALMIC

## 2015-07-28 MED ORDER — LIDOCAINE HCL (PF) 4 % IJ SOLN
INTRAMUSCULAR | Status: DC | PRN
  Administered 2015-07-28: 09:00:00 via OPHTHALMIC

## 2015-07-28 MED ORDER — SODIUM HYALURONATE 10 MG/ML IO SOLN
INTRAOCULAR | Status: AC
  Filled 2015-07-28: qty 0.85

## 2015-07-28 MED ORDER — POVIDONE-IODINE 5 % OP SOLN
OPHTHALMIC | Status: AC
  Filled 2015-07-28: qty 30

## 2015-07-28 MED ORDER — MOXIFLOXACIN HCL 0.5 % OP SOLN
OPHTHALMIC | Status: AC
  Filled 2015-07-28: qty 3

## 2015-07-28 MED ORDER — MOXIFLOXACIN HCL 0.5 % OP SOLN
OPHTHALMIC | Status: DC | PRN
  Administered 2015-07-28: 1 [drp] via OPHTHALMIC

## 2015-07-28 MED ORDER — ALFENTANIL 500 MCG/ML IJ INJ
INJECTION | INTRAMUSCULAR | Status: DC | PRN
  Administered 2015-07-28: 250 ug via INTRAVENOUS

## 2015-07-28 MED ORDER — BSS IO SOLN
INTRAOCULAR | Status: DC | PRN
  Administered 2015-07-28: 09:00:00 via OPHTHALMIC

## 2015-07-28 SURGICAL SUPPLY — 22 items
CANNULA ANT/CHMB 27GA (MISCELLANEOUS) ×6 IMPLANT
CUP MEDICINE 2OZ PLAST GRAD ST (MISCELLANEOUS) ×3 IMPLANT
GLOVE BIO SURGEON STRL SZ8 (GLOVE) ×3 IMPLANT
GLOVE BIOGEL M 6.5 STRL (GLOVE) ×3 IMPLANT
GLOVE SURG LX 7.5 STRW (GLOVE) ×2
GLOVE SURG LX STRL 7.5 STRW (GLOVE) ×1 IMPLANT
GOWN STRL REUS W/ TWL LRG LVL3 (GOWN DISPOSABLE) ×2 IMPLANT
GOWN STRL REUS W/TWL LRG LVL3 (GOWN DISPOSABLE) ×4
LENS IOL ACRSF IQ PC 19.5 (Intraocular Lens) ×1 IMPLANT
LENS IOL ACRYSOF IQ POST 19.5 (Intraocular Lens) ×3 IMPLANT
PACK CATARACT (MISCELLANEOUS) ×3 IMPLANT
PACK CATARACT BRASINGTON LX (MISCELLANEOUS) ×3 IMPLANT
PACK EYE AFTER SURG (MISCELLANEOUS) ×3 IMPLANT
SOL BSS BAG (MISCELLANEOUS) ×3
SOL PREP PVP 2OZ (MISCELLANEOUS) ×3
SOLUTION BSS BAG (MISCELLANEOUS) ×1 IMPLANT
SOLUTION PREP PVP 2OZ (MISCELLANEOUS) ×1 IMPLANT
SYR 3ML LL SCALE MARK (SYRINGE) ×6 IMPLANT
SYR 5ML LL (SYRINGE) ×3 IMPLANT
SYR TB 1ML 27GX1/2 LL (SYRINGE) ×3 IMPLANT
WATER STERILE IRR 1000ML POUR (IV SOLUTION) ×3 IMPLANT
WIPE NON LINTING 3.25X3.25 (MISCELLANEOUS) ×3 IMPLANT

## 2015-07-28 NOTE — Transfer of Care (Signed)
Immediate Anesthesia Transfer of Care Note  Patient: [place]  Procedure(s) Performed: Procedure(s) with comments: CATARACT EXTRACTION PHACO AND INTRAOCULAR LENS PLACEMENT (IOC) (Left) - [place] 30.3 AP% 12.0 CDE 3.66 FLUID PACK LOT # [id number]  Patient Location: PACU and Short Stay  Anesthesia Type:MAC  Level of Consciousness: awake, oriented and patient cooperative  Airway & Oxygen Therapy: Patient Spontanous Breathing and Patient connected to nasal cannula oxygen  Post-op Assessment: Report given to RN and Post -op Vital signs reviewed and stable  Post vital signs: Reviewed and stable  Last Vitals:  Filed Vitals:   07/28/15 0711  BP: 131/63  Pulse: 61  Temp: 36.3 C  Resp: 16    Last Pain: There were no vitals filed for this visit.       Complications: No apparent anesthesia complications

## 2015-07-28 NOTE — Op Note (Signed)
OPERATIVE NOTE  Catherine Carlson LF:[id number] 07/28/2015   PREOPERATIVE DIAGNOSIS:  Nuclear sclerotic cataract left eye.  H25.12   POSTOPERATIVE DIAGNOSIS:    Nuclear sclerotic cataract left eye.     PROCEDURE:  Phacoemusification with posterior chamber intraocular lens placement of the left eye   LENS:   Implant Name Type Inv. Item Serial No. Manufacturer Lot No. LRB No. Used  IMPLANT LENS - [id number] Intraocular Lens IMPLANT LENS [id number]   Left 1      [id number] 19.5D IOL.    ULTRASOUND TIME: 0 minutes 30 seconds.  CDE 3.66   SURGEON:  Benay Pillow, MD, MPH   ANESTHESIA:  Topical with tetracaine drops and 2% Xylocaine jelly, augmented with 1% preservative-free intracameral lidocaine.   COMPLICATIONS:  None.   DESCRIPTION OF PROCEDURE:  The patient was identified in the holding room and transported to the operating room and placed in the supine position under the operating microscope.  The left eye was identified as the operative eye and it was prepped and draped in the usual sterile ophthalmic fashion.  The palpebral fissure was small, but access was adequate.   A 1.0 millimeter clear-corneal paracentesis was made at the 5:00 position. 0.5 ml of preservative-free 1% lidocaine with epinephrine was injected into the anterior chamber.  The anterior chamber was filled with Healon5 viscoelastic.  A 2.4 millimeter keratome was used to make a near-clear corneal incision at the 2:00 position.  A curvilinear capsulorrhexis was made with a cystotome and capsulorrhexis forceps.  Balanced salt solution was used to hydrodissect and hydrodelineate the nucleus.   Phacoemulsification was then used in stop and chop fashion to remove the lens nucleus and epinucleus.  The remaining cortex was then removed using the irrigation and aspiration handpiece. Healon was then placed into the capsular bag to distend it for lens placement.  A lens was then injected into the capsular bag.  The remaining  viscoelastic was aspirated.   Wounds were hydrated with balanced salt solution.  The anterior chamber was inflated to a physiologic pressure with balanced salt solution.   Intracameral vigamox 0.1 mL at undiluted was injected into the eye.  No wound leaks were noted.  Topical Vigamox drops were applied to the eye.  The patient was taken to the recovery room in stable condition without complications of anesthesia or surgery  Benay Pillow 07/28/2015, 9:34 AM

## 2015-07-28 NOTE — Anesthesia Preprocedure Evaluation (Addendum)
Anesthesia Evaluation  Patient identified by MRN, date of birth, ID band Patient awake    Reviewed: Allergy & Precautions, NPO status , Patient's Chart, lab work & pertinent test results, reviewed documented beta blocker date and time   History of Anesthesia Complications Negative for: history of anesthetic complications  Airway Mallampati: III  TM Distance: >3 FB Neck ROM: Full    Dental no notable dental hx.    Pulmonary neg COPD,    breath sounds clear to auscultation- rhonchi (-) wheezing      Cardiovascular Exercise Tolerance: Good hypertension, Pt. on home beta blockers (-) CAD and (-) Past MI  Rhythm:Regular Rate:Normal - Systolic murmurs and - Diastolic murmurs    Neuro/Psych negative psych ROS   GI/Hepatic negative GI ROS, Neg liver ROS,   Endo/Other  neg diabetesHypothyroidism   Renal/GU negative Renal ROS     Musculoskeletal negative musculoskeletal ROS (+)   Abdominal (+) - obese,   Peds  Hematology negative hematology ROS (+)   Anesthesia Other Findings Past Medical History:   Cancer ([place])                                                 Family history of breast cancer                                Comment:PGM   Vitamin D deficiency                            10/15/2011    Osteopenia                                                   Hypertension                                                 Seizures (HCC)                                               Tremor of both hands                                         Hypothyroidism                                               Reproductive/Obstetrics negative OB ROS                            Anesthesia Physical Anesthesia Plan  ASA: II  Anesthesia Plan: MAC   Post-op Pain Management:    Induction:   Airway Management Planned: Natural  Airway  Additional Equipment:   Intra-op Plan:   Post-operative Plan:    Informed Consent: I have reviewed the patients History and Physical, chart, labs and discussed the procedure including the risks, benefits and alternatives for the proposed anesthesia with the patient or authorized representative who has indicated his/her understanding and acceptance.     Plan Discussed with: CRNA and Anesthesiologist  Anesthesia Plan Comments:         Anesthesia Quick Evaluation

## 2015-07-28 NOTE — Discharge Instructions (Signed)
AMBULATORY SURGERY  DISCHARGE INSTRUCTIONS   1) The drugs that you were given will stay in your system until tomorrow so for the next 24 hours you should not:  A) Drive an automobile B) Make any legal decisions C) Drink any alcoholic beverage   2) You may resume regular meals tomorrow.  Today it is better to start with liquids and gradually work up to solid foods.  You may eat anything you prefer, but it is better to start with liquids, then soup and crackers, and gradually work up to solid foods.   3) Please notify your doctor immediately if you have any unusual bleeding, trouble breathing, redness and pain at the surgery site, drainage, fever, or pain not relieved by medication.    4) Additional Instructions:     Eye Surgery Discharge Instructions  Expect mild scratchy sensation or mild soreness. DO NOT RUB YOUR EYE!  The day of surgery:  Minimal physical activity, but bed rest is not required  No reading, computer work, or close hand work  No bending, lifting, or straining.  May watch TV  For 24 hours:  No driving, legal decisions, or alcoholic beverages  Safety precautions  Eat anything you prefer: It is better to start with liquids, then soup then solid foods.  _____ Eye patch should be worn until postoperative exam tomorrow.  ____ Solar shield eyeglasses should be worn for comfort in the sunlight/patch while sleeping  Resume all regular medications including aspirin or Coumadin if these were discontinued prior to surgery. You may shower, bathe, shave, or wash your hair. Tylenol may be taken for mild discomfort.  Call your doctor if you experience significant pain, nausea, or vomiting, fever > 101 or other signs of infection. [phone] or [phone] Specific instructions:  Follow-up Information    Follow up with Benay Pillow, MD.   Specialty:  Ophthalmology   Why:  July 21 at 10:30am   Contact information:   [address] [place]  [address] [phone]        Please contact your physician with any problems or Same Day Surgery at [phone], Monday through Friday 6 am to 4 pm, or [place] at [facility] number at [phone].

## 2015-07-28 NOTE — H&P (Signed)
  The History and Physical notes are on paper, have been signed, and are to be scanned. The patient remains stable and unchanged from the H&P.   Previous H&P reviewed, patient examined, and there are no changes.  Benay Pillow 07/28/2015 8:58 AM

## 2015-07-28 NOTE — Anesthesia Postprocedure Evaluation (Signed)
Anesthesia Post Note  Patient: Catherine Carlson  Procedure(s) Performed: Procedure(s) (LRB): CATARACT EXTRACTION PHACO AND INTRAOCULAR LENS PLACEMENT (IOC) (Left)  Patient location during evaluation: PACU Anesthesia Type: MAC Level of consciousness: awake and alert and oriented Pain management: pain level controlled Vital Signs Assessment: post-procedure vital signs reviewed and stable Respiratory status: spontaneous breathing, nonlabored ventilation and respiratory function stable Cardiovascular status: stable and blood pressure returned to baseline Anesthetic complications: no    Last Vitals:  Filed Vitals:   07/28/15 0711 07/28/15 0937  BP: 131/63 134/62  Pulse: 61 54  Temp: 36.3 C 36.5 C  Resp: 16 16    Last Pain: There were no vitals filed for this visit.               Sherrilyn Nairn

## 2015-10-26 ENCOUNTER — Other Ambulatory Visit: Payer: Self-pay | Admitting: Oncology

## 2015-11-01 ENCOUNTER — Other Ambulatory Visit: Payer: Self-pay | Admitting: Oncology

## 2015-11-01 DIAGNOSIS — Z1231 Encounter for screening mammogram for malignant neoplasm of breast: Secondary | ICD-10-CM

## 2015-11-28 ENCOUNTER — Ambulatory Visit
Admission: RE | Admit: 2015-11-28 | Discharge: 2015-11-28 | Disposition: A | Discharge status: Home / Self Care | Payer: Medicare Other | Source: Ambulatory Visit | Attending: Oncology | Admitting: Oncology

## 2015-11-28 DIAGNOSIS — Z1231 Encounter for screening mammogram for malignant neoplasm of breast: Secondary | ICD-10-CM

## 2015-11-30 ENCOUNTER — Other Ambulatory Visit: Payer: Self-pay | Admitting: *Deleted

## 2015-11-30 DIAGNOSIS — C50512 Malignant neoplasm of lower-outer quadrant of left female breast: Secondary | ICD-10-CM

## 2015-12-05 ENCOUNTER — Encounter: Payer: Self-pay | Admitting: Oncology

## 2015-12-05 ENCOUNTER — Other Ambulatory Visit (HOSPITAL_BASED_OUTPATIENT_CLINIC_OR_DEPARTMENT_OTHER): Payer: Medicare Other

## 2015-12-05 ENCOUNTER — Telehealth: Payer: Self-pay | Admitting: Oncology

## 2015-12-05 ENCOUNTER — Other Ambulatory Visit: Payer: Self-pay | Admitting: Oncology

## 2015-12-05 ENCOUNTER — Ambulatory Visit: Payer: Medicare Other | Admitting: Nurse Practitioner

## 2015-12-05 ENCOUNTER — Ambulatory Visit (HOSPITAL_BASED_OUTPATIENT_CLINIC_OR_DEPARTMENT_OTHER): Payer: Medicare Other | Admitting: Oncology

## 2015-12-05 VITALS — BP 121/56 | HR 66 | Temp 97.7°F | Resp 18 | Ht 65.0 in | Wt 155.4 lb

## 2015-12-05 DIAGNOSIS — C50912 Malignant neoplasm of unspecified site of left female breast: Secondary | ICD-10-CM

## 2015-12-05 DIAGNOSIS — M8580 Other specified disorders of bone density and structure, unspecified site: Secondary | ICD-10-CM | POA: Diagnosis not present

## 2015-12-05 DIAGNOSIS — Z79899 Other long term (current) drug therapy: Secondary | ICD-10-CM

## 2015-12-05 DIAGNOSIS — N63 Unspecified lump in unspecified breast: Secondary | ICD-10-CM | POA: Diagnosis not present

## 2015-12-05 DIAGNOSIS — R928 Other abnormal and inconclusive findings on diagnostic imaging of breast: Secondary | ICD-10-CM

## 2015-12-05 DIAGNOSIS — E559 Vitamin D deficiency, unspecified: Secondary | ICD-10-CM

## 2015-12-05 DIAGNOSIS — Z780 Asymptomatic menopausal state: Secondary | ICD-10-CM

## 2015-12-05 DIAGNOSIS — C50512 Malignant neoplasm of lower-outer quadrant of left female breast: Secondary | ICD-10-CM

## 2015-12-05 LAB — CBC WITH DIFFERENTIAL/PLATELET
BASO%: 0.2 % (ref 0.0–2.0)
BASOS ABS: 0 10*3/uL (ref 0.0–0.1)
EOS ABS: 0.3 10*3/uL (ref 0.0–0.5)
EOS%: 4.4 % (ref 0.0–7.0)
HEMATOCRIT: 37.7 % (ref 34.8–46.6)
HGB: 12.3 g/dL (ref 11.6–15.9)
LYMPH%: 17.6 % (ref 14.0–49.7)
MCH: 30.7 pg (ref 25.1–34.0)
MCHC: 32.8 g/dL (ref 31.5–36.0)
MCV: 93.6 fL (ref 79.5–101.0)
MONO#: 0.6 10*3/uL (ref 0.1–0.9)
MONO%: 8.9 % (ref 0.0–14.0)
NEUT#: 5 10*3/uL (ref 1.5–6.5)
NEUT%: 68.9 % (ref 38.4–76.8)
PLATELETS: 221 10*3/uL (ref 145–400)
RBC: 4.02 10*6/uL (ref 3.70–5.45)
RDW: 13.1 % (ref 11.2–14.5)
WBC: 7.2 10*3/uL (ref 3.9–10.3)
lymph#: 1.3 10*3/uL (ref 0.9–3.3)

## 2015-12-05 LAB — COMPREHENSIVE METABOLIC PANEL
ALT: 9 U/L (ref 0–55)
ANION GAP: 10 meq/L (ref 3–11)
AST: 13 U/L (ref 5–34)
Albumin: 3.5 g/dL (ref 3.5–5.0)
Alkaline Phosphatase: 89 U/L (ref 40–150)
BUN: 20.4 mg/dL (ref 7.0–26.0)
CALCIUM: 9.7 mg/dL (ref 8.4–10.4)
CHLORIDE: 103 meq/L (ref 98–109)
CO2: 28 meq/L (ref 22–29)
Creatinine: 1.1 mg/dL (ref 0.6–1.1)
EGFR: 50 mL/min/{1.73_m2} — AB (ref >90)
Glucose: 82 mg/dl (ref 70–140)
Potassium: 3.7 mEq/L (ref 3.5–5.1)
Sodium: 141 mEq/L (ref 136–145)
Total Bilirubin: 0.22 mg/dL (ref 0.20–1.20)
Total Protein: 6.4 g/dL (ref 6.4–8.3)

## 2015-12-05 LAB — DRAW EXTRA CLOT TUBE

## 2015-12-05 MED ORDER — ANASTROZOLE 1 MG PO TABS: 1.0000 mg | ORAL_TABLET | Freq: Every day | ORAL | 3 refills | Status: DC

## 2015-12-05 NOTE — Progress Notes (Signed)
ID: Catherine Carlson   DOB: 06/06/1940  MR#: [id number]  CSN#:[id number]  PCP: LONG,THOMAS JR, MD GYN:  SU:  OTHER MD: Nicole Jelesoff MD, James Snapper MD  CHIEF COMPLAINT:  Left Breast Cancer  CURRENT TREATMENT: Anastrozole   HISTORY OF PRESENT ILLNESS:    From the original intake note:    Catherine Carlson is a 75-year-old female who early in 2003, noticed approximately a pea-sized nodule in the lower outer quadrant of her left breast.  She routinely performed self-breast examinations, and this was a new finding for her. She denies any pain in the breast area, nipple discharge or bleeding. The patient had undergone a routine screening mammogram at [facility] on April 22, 2001 which showed no suspicious areas in either breast.  The patient proceeded to be seen by Dr. Price, who palpated a lesion in the 4 o'clock position of the breast which was very close to the skin.  Ultrasound of this area revealed it was not a simple cyst.  In light of the above findings, the patient proceeded to undergo excisional biopsy with pathology returning invasive ductal carcinoma, Grade 2 out of 3. 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  The tumor did focally involve the superficial margin. In light of this, the patient proceeded to undergo reexcision.  In addition, at the same time, she underwent a sentinel node procedure. The patient was found to have no residual carcinoma in the reexcision.  The sentinel node specimen revealed no evidence of metastatic disease.  Her subsequent history is as detailed below   INTERVAL HISTORY: Catherine Carlson returns today for followup of her left breast cancer. She has done pretty well over the past year. Reports that her Synthroid dosing was adjusted recently. She has been being followed at [facility] for a 4 mm right middle lobe nodule, with a CT of the chest from [facility] obtained 11/16/2014 also  copied below, showing stability. Has not had a follow-up CT since then. Plans to talk with her primary care provider about ordering this. States that she is overdue for colonoscopy and is due to see GI later this week. The patient had her annual mammogram last week. The report notes a mass in the left breast and further imaging is recommended. The patient had not yet been contacted about these results when discussed these with her today. She has not noticed any lumps or bumps in either breast. No skin changes.  Catherine Carlson continues on anastrozole. She obtains it for approximately $3 a month. She reports no side effects from this medication.  REVIEW OF SYSTEMS: Aside from the issues noted above a detailed review of systems today was stable  PAST MEDICAL HISTORY: Past Medical History:  Diagnosis Date  . Cancer (HCC)   . Family history of breast cancer    PGM  . Hypertension   . Hypothyroidism   . Osteopenia   . Seizures (HCC)   . Tremor of both hands   . Vitamin D deficiency 10/15/2011  :    The patient underwent a hysterectomy in 1973 for endometriosis.  She has a history of hypertension.  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  The patient was previously on Synthroid in the past for thyroid problems but none currently.  The patient has retained one ovary.    PAST SURGICAL HISTORY: Past Surgical History:  Procedure Laterality Date  .   ABDOMINAL HYSTERECTOMY  1970  . BREAST LUMPECTOMY  06/10/2001   Left - Dr Tom Price  . CATARACT EXTRACTION W/PHACO Left 07/28/2015   Procedure: CATARACT EXTRACTION PHACO AND INTRAOCULAR LENS PLACEMENT (IOC);  Surgeon: Bradley Mark King, MD;  Location: [facility] ORS;  Service: Ophthalmology;  Laterality: Left;  US 30.3 AP% 12.0 CDE 3.66 FLUID PACK LOT # [id number]  . EYE SURGERY    . THYROIDECTOMY      FAMILY HISTORY Family History  Problem Relation Age of Onset  . Heart disease Mother   . Heart disease  Father   Significant for paternal grandmother with history of breast cancer, diagnosed at age 76.   There is no other family history of breast or gynecologic malignancies or other type malignancies.    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 Her husband of 53 years, henry, was "troubleshoot her" for a local company. They have an adopted son, Ron, 43, who runs the patient's farm. They grow tobacco, soybean's, and week. She has 2 grandsons, age 17 and 14. The patient is a [employer].   ADVANCED DIRECTIVES: in place  HEALTH MAINTENANCE: (Updated 10/23/2012) Social History  Substance Use Topics  . Smoking status: Never Smoker  . Smokeless tobacco: Never Used  . Alcohol use No     Colonoscopy: 2012/[facility]  PAP: Status post hysterectomy  Bone density: July 2013, osteopenia  Lipid panel: Dr. Jones  Allergies  Allergen Reactions  . Morphine And Related Itching, Nausea And Vomiting and Other (See Comments)    Caused GI upset. Also caused patient to black out. Unaware of what was going on around her. Need help to move around.  . Penicillins Anaphylaxis and Hives    Hives go around the neck Has patient had a PCN reaction causing immediate rash, facial/tongue/throat swelling, SOB or lightheadedness with hypotension: Yes Has patient had a PCN reaction causing severe rash involving mucus membranes or skin necrosis: Yes Has patient had a PCN reaction that required hospitalization No Has patient had a PCN reaction occurring within the last 10 years: No If all of the above answers are "NO", then may proceed with Cephalosporin use.   . Codeine Nausea And Vomiting    Current Outpatient Prescriptions  Medication Sig Dispense Refill  . amLODipine (NORVASC) 10 MG tablet Take 10 mg by mouth daily.    . anastrozole (ARIMIDEX) 1 MG tablet Take 1 tablet (1 mg total) by mouth daily. 90 tablet 3  . aspirin 81 MG tablet Take 81 mg  by mouth daily.      . calcium citrate (CALCITRATE - DOSED IN MG ELEMENTAL CALCIUM) 950 MG tablet Take 1 tablet by mouth daily.      . carbamazepine (TEGRETOL) 200 MG tablet Take 400 mg by mouth at bedtime.     . carvedilol (COREG) 12.5 MG tablet Take 12.5 mg by mouth 2 (two) times daily with a meal.     . cholecalciferol (VITAMIN D) 1000 UNITS tablet Take 2,000 Units by mouth daily.     . escitalopram (LEXAPRO) 10 MG tablet Take 10 mg by mouth daily.    . levothyroxine (SYNTHROID, LEVOTHROID) 112 MCG tablet Take 112 mcg by mouth daily before breakfast.    . triamterene-hydrochlorothiazide (DYAZIDE) 37.5-25 MG capsule Take 1 capsule by mouth daily.     No current facility-administered medications for this visit.     OBJECTIVE: Elderly white womanwho appears stated age Vitals:     12/05/15 1000  BP: (!) 121/56  Pulse: 66  Resp: 18  Temp: 97.7 F (36.5 C)     Body mass index is 25.86 kg/m.    ECOG FS: 1 Filed Weights   12/05/15 1000  Weight: 155 lb 6.4 oz (70.5 kg)   Sclerae unicteric, pupils round and equal Oropharynx clear and moist-- no thrush or other lesions No cervical or supraclavicular adenopathy Lungs no rales or rhonchi Heart regular rate and rhythm Abd soft, nontender, positive bowel sounds MSK no focal spinal tenderness, no upper extremity lymphedema Neuro: nonfocal, well oriented, appropriate affect Breasts: the right breast is unremarkable. The left breast is status post lumpectomy and radiation. There is no evidence of local recurrence. The left axilla is benign.    LAB RESULTS: Lab Results  Component Value Date   WBC 7.2 12/05/2015   NEUTROABS 5.0 12/05/2015   HGB 12.3 12/05/2015   HCT 37.7 12/05/2015   MCV 93.6 12/05/2015   PLT 221 12/05/2015      Chemistry      Component Value Date/Time   NA 141 12/05/2015 0938   K 3.7 12/05/2015 0938   CL 98 10/15/2011 1342   CO2 28 12/05/2015 0938   BUN 20.4 12/05/2015 0938   CREATININE 1.1 12/05/2015 0938       Component Value Date/Time   CALCIUM 9.7 12/05/2015 0938   ALKPHOS 89 12/05/2015 0938   AST 13 12/05/2015 0938   ALT 9 12/05/2015 0938   BILITOT 0.22 12/05/2015 0938       STUDIES: Mammography and repeat bone density pending  Cytology, Non GYN Fine Needle Aspiration Biopsy (04/14/2014 1:10 PM) Cytology, Non GYN Fine Needle Aspiration Biopsy (04/14/2014 1:10 PM)  Component Value Ref Range  Case Report Fine Needle AspirateCase: [id number]  Authorizing Provider:Bhavik Posey Pronto, MD Collected: 04/14/2014 1310 [facility]04[facility]2014 [place]  Specimen:Thyroid, RT THYROID MID/POST 6     Specimen Source A Thyroid, right, mid/posterior #6, ultrasound-guided fine needle aspiration biopsy   Diagnostic Interpretation A NEGATIVE. NO EVIDENCE OF MALIGNANCY.    Scant follicular epithelium in a degenerated/cystic background.    Procedure Notes Ultrasound guided fine needle aspiration biopsy of right thyroid mid/ post 6performed by Dr. Posey Pronto, radiologist.    Clinical Information 75 year old female with history of hyperthyroidism and multiple thyroid nodules.   Material Submitted 9 mls clear fluid received in normosol. 2 alcohol fixed smear(s) and 2 air dried smear(s) prepared. 1 ThinPrep slide prepared.    Immediate Assessment A Adequate (by Retta Mac).     Pathology - General / Other (04/28/2014 10:35 AM) Pathology - General / Other (04/28/2014 10:35 AM)  Component Value Ref Range  Case Report Surgical PathologyCase: [id number]  Authorizing Provider:Sanziana Cristy Folks, MD Collected:  04/28/2014 1035 Ordering Location: [facility]iopReceived:04/28/2014 [facility] Pathologist: Samantha Crimes,  MD  Specimen:Thyroid, Total Thyroid Double Right Upper Single Left Lower    DIAGNOSIS A. Thyroid, total thyroidectomy:  Benign thyroid tissue with nodular hyperplasia and FNA site changes. One benign lymph node identified (0/1    Result Narrative  CT Chest without contrast  Indication: R91.1 Solitary pulmonary nodule, RML Nodule  Compare: 07/30/2014  Technique:  Volumetric non-contrast chest CT acquisition was performed from the lower neck to the adrenal glands.  1.25 mm axial, 5 mm axial, 3 mm coronal, and 3 mm sagittal reconstructions were performed. 3D axial maximum intensity projection images (MIPS) were reconstructed to facilitate lung nodule detection.   Findings:   The  visualized thyroid gland and neck base are unremarkable. The heart is normal in size with no pericardial effusion. Thoracic aorta is normal in course and caliber. Normal caliber pulmonary artery. Significant mitral annular calcifications.  There is no supraclavicular or axillary lymphadenopathy.   There is no mediastinal lymphadenopathy.  Left axillary surgical clips.  The central airways are patent. Minimal distal mucous plugging in the right lower lobe.  Right upper lobe subpleural opacity (series 301 image 72), likely pleural parenchymal scarring. Stable 4 mm right middle lobe nodule, not significantly changed given differences in technique. Bandlike opacity in the anterior right middle lobe with mildly nodular component inferiorly, which is favored to represent scarring or focal atelectasis. Subpleural reticular opacities, likely atelectasis. Scarring  around osteophyte in the right lower lobe.   Visualized portions of the upper abdomen are unremarkable. Small hiatal hernia.  No acute or aggressive appearing osseous lesions. Multiple degenerative changes of the spine.  Impression:  No significant change in 4 mm right middle lobe nodule not significantly changed given differences in technique. If patient has risk factors for lung cancer, recommend 12 month follow up CT.   The preliminary report (critical or emergent communication) was reviewed prior to this dictation and there are no substantial differences between the preliminary results and the impressions in this final report.  Electronically Reviewed by:  Amber Mittendorf, MD Electronically Reviewed on:  11/17/2014 10:16 AM  I have reviewed the images and concur with the above findings.  Electronically Signed by:  Jared Christensen, MD Electronically Signed on:  11/20/2014 5:41 PM   Status Results Details   Encounter Summary   CLINICAL DATA: Postmenopausal. Patient is currently being treated with Reclast.  EXAM: DUAL X-RAY ABSORPTIOMETRY (DXA) FOR BONE MINERAL DENSITY  FINDINGS: AP LUMBAR SPINE (L1-L4)  Bone Mineral Density (BMD): 0.863 g/cm2  Young Adult T-Score: -1.7  Z-Score: 0.7  LEFT FEMUR NECK  Bone Mineral Density (BMD): 0.720 g/cm2  Young Adult T-Score: -1.2  Z-Score: -0.9  ASSESSMENT: Patient's diagnostic category is LOW BONE MASS by WHO Criteria.  FRACTURE RISK: UNKNOWN IN THIS PATIENT TAKING RECAST.  FRAX: World Health Organization FRAX assessment of absolute fracture risk is not calculated for this patient because the patient has a history of bone building therapy.  COMPARISON: The bone mineral density of the lumbar spine has decreased by 3.5% and the bone mineral density of the left hip has not significantly changed compared to the baseline study dated 11/19/2000. The bone mineral density of the lumbar spine  has increased by 4.6% and the bone mineral density of the left hip has increased by 3.4% compared to the prior exam dated 07/30/2011.  Mm Screening Breast Tomo Bilateral  Result Date: 12/02/2015 CLINICAL DATA:  Screening. EXAM: 2D DIGITAL SCREENING BILATERAL MAMMOGRAM WITH CAD AND ADJUNCT TOMO COMPARISON:  Previous exam(s). ACR Breast Density Category d: The breast tissue is extremely dense, which lowers the sensitivity of mammography. FINDINGS: In the left breast, a possible mass warrants further evaluation. In the right breast, no findings suspicious for malignancy. Images were processed with CAD. IMPRESSION: Further evaluation is suggested for possible mass in the left breast. RECOMMENDATION: Diagnostic mammogram and possibly ultrasound of the left breast. (Code:[id number]) The patient will be contacted regarding the findings, and additional imaging will be scheduled. BI-RADS CATEGORY  0: Incomplete. Need additional imaging evaluation and/or prior mammograms for comparison. Electronically Signed   By: Michelle  Collins M.D.   On: 12/02/2015 15:08    ASSESSMENT: 75 y.o. [place] woman   (  1)  status post left lumpectomy and sentinel lymph node biopsy June 2003 for a T2 N0 invasive ductal carcinoma, ER positive, PR and HER2 negative,   (2)  status post Cytoxan and Adriamycin times four  (3)  Received radiation therapy, completed October 2003   (4)  Arimidex started in October 2003.  Continues with good tolerance, the plan being to continue indefinitely  (5) osteopenia, currently receiving zoledronic acid annually, with first dose on 10/26/2011, most recent dfose 03/12/2014   PLAN:   Tanaya had a recent mammogram showing a mass in the left breast. I have discussed these results with her today and have ordered additional imaging including a diagnostic mammogram of the left breast with a left breast ultrasound to be done within one week. We discussed that this may not indicate the cancer, but it  does need to be further investigated. The patient continues on anastrozole and wants to stay on this indefinitely. She understands we do not have data to continue anastrozole beyond 10 years but her plan is to continue indefinitely. She obtains it under very good price and has no side effects from it. The patient has osteopenia and is on alendronate annually. She will be due for this again in March 2018. She is due for a bone density scan and I have ordered this to be done within the next one month.  [facility] and a bone density scan. She will follow-up in our office in March 2018 for her Zometa. I have tentatively scheduled her back for her annual follow-up in November 2018. Of course if her mammogram is normal, we can certainly see her sooner.  We discussed falls and fall prevention in detail today  Jovonna knows to call for any problems that may develop before her next visit here.    CURCIO, KRISTIN    12/05/2015   

## 2015-12-05 NOTE — Telephone Encounter (Signed)
Gave patient avs report and appointments for March and December 2018. Patient also given appointments at [facility] for mammo/us 11/30 and dexa scan 12/6.

## 2015-12-07 ENCOUNTER — Other Ambulatory Visit: Payer: Self-pay | Admitting: Oncology

## 2015-12-07 ENCOUNTER — Other Ambulatory Visit: Payer: Self-pay

## 2015-12-07 DIAGNOSIS — R928 Other abnormal and inconclusive findings on diagnostic imaging of breast: Secondary | ICD-10-CM

## 2015-12-08 ENCOUNTER — Ambulatory Visit
Admission: RE | Admit: 2015-12-08 | Discharge: 2015-12-08 | Disposition: A | Discharge status: Home / Self Care | Payer: Medicare Other | Source: Ambulatory Visit | Attending: Oncology | Admitting: Oncology

## 2015-12-08 DIAGNOSIS — R928 Other abnormal and inconclusive findings on diagnostic imaging of breast: Secondary | ICD-10-CM

## 2015-12-14 ENCOUNTER — Ambulatory Visit
Admission: RE | Admit: 2015-12-14 | Discharge: 2015-12-14 | Disposition: A | Discharge status: Home / Self Care | Payer: Medicare Other | Source: Ambulatory Visit | Attending: Oncology | Admitting: Oncology

## 2015-12-14 DIAGNOSIS — E559 Vitamin D deficiency, unspecified: Secondary | ICD-10-CM

## 2015-12-14 DIAGNOSIS — Z79899 Other long term (current) drug therapy: Secondary | ICD-10-CM

## 2015-12-14 DIAGNOSIS — C50512 Malignant neoplasm of lower-outer quadrant of left female breast: Secondary | ICD-10-CM

## 2015-12-14 DIAGNOSIS — M8580 Other specified disorders of bone density and structure, unspecified site: Secondary | ICD-10-CM

## 2015-12-14 DIAGNOSIS — Z780 Asymptomatic menopausal state: Secondary | ICD-10-CM

## 2016-03-25 ENCOUNTER — Other Ambulatory Visit: Payer: Self-pay | Admitting: Oncology

## 2016-03-26 ENCOUNTER — Other Ambulatory Visit: Payer: Medicare Other

## 2016-03-26 ENCOUNTER — Ambulatory Visit: Payer: Medicare Other

## 2016-05-02 ENCOUNTER — Ambulatory Visit (HOSPITAL_BASED_OUTPATIENT_CLINIC_OR_DEPARTMENT_OTHER): Payer: Medicare Other

## 2016-05-02 ENCOUNTER — Other Ambulatory Visit (HOSPITAL_BASED_OUTPATIENT_CLINIC_OR_DEPARTMENT_OTHER): Payer: Medicare Other

## 2016-05-02 VITALS — BP 115/60 | HR 60 | Temp 97.9°F | Resp 18

## 2016-05-02 DIAGNOSIS — M8580 Other specified disorders of bone density and structure, unspecified site: Secondary | ICD-10-CM

## 2016-05-02 DIAGNOSIS — C50512 Malignant neoplasm of lower-outer quadrant of left female breast: Secondary | ICD-10-CM

## 2016-05-02 DIAGNOSIS — E559 Vitamin D deficiency, unspecified: Secondary | ICD-10-CM

## 2016-05-02 DIAGNOSIS — C50912 Malignant neoplasm of unspecified site of left female breast: Secondary | ICD-10-CM | POA: Diagnosis present

## 2016-05-02 DIAGNOSIS — Z780 Asymptomatic menopausal state: Secondary | ICD-10-CM

## 2016-05-02 DIAGNOSIS — Z853 Personal history of malignant neoplasm of breast: Secondary | ICD-10-CM

## 2016-05-02 LAB — BASIC METABOLIC PANEL
Anion Gap: 14 mEq/L — ABNORMAL HIGH (ref 3–11)
BUN: 24.5 mg/dL (ref 7.0–26.0)
CALCIUM: 9.9 mg/dL (ref 8.4–10.4)
CHLORIDE: 102 meq/L (ref 98–109)
CO2: 24 meq/L (ref 22–29)
CREATININE: 1.1 mg/dL (ref 0.6–1.1)
EGFR: 48 mL/min/{1.73_m2} — ABNORMAL LOW (ref >90)
Glucose: 131 mg/dl (ref 70–140)
Potassium: 3.8 mEq/L (ref 3.5–5.1)
Sodium: 140 mEq/L (ref 136–145)

## 2016-05-02 MED ORDER — ZOLEDRONIC ACID 4 MG/100ML IV SOLN
4.0000 mg | Freq: Once | INTRAVENOUS | Status: AC
  Administered 2016-05-02: 4 mg via INTRAVENOUS
  Filled 2016-05-02: qty 100

## 2016-05-02 MED ORDER — SODIUM CHLORIDE 0.9 % IV SOLN: Freq: Once | INTRAVENOUS | Status: DC

## 2016-05-02 NOTE — Patient Instructions (Signed)

## 2016-06-27 ENCOUNTER — Other Ambulatory Visit: Payer: Self-pay | Admitting: Nurse Practitioner

## 2016-09-08 ENCOUNTER — Other Ambulatory Visit: Payer: Self-pay | Admitting: Nurse Practitioner

## 2016-10-18 ENCOUNTER — Other Ambulatory Visit: Payer: Self-pay

## 2016-10-18 ENCOUNTER — Telehealth: Payer: Self-pay | Admitting: Oncology

## 2016-10-18 MED ORDER — ANASTROZOLE 1 MG PO TABS: 1.0000 mg | ORAL_TABLET | Freq: Every day | ORAL | 3 refills | Status: DC

## 2016-10-18 NOTE — Telephone Encounter (Signed)
10/17/16 prescription refill scanned in media to view °

## 2016-11-27 NOTE — Progress Notes (Signed)
No show

## 2016-12-03 ENCOUNTER — Ambulatory Visit: Payer: Medicare Other | Admitting: Oncology

## 2016-12-03 ENCOUNTER — Other Ambulatory Visit: Payer: Self-pay | Admitting: Oncology

## 2016-12-03 DIAGNOSIS — Z1231 Encounter for screening mammogram for malignant neoplasm of breast: Secondary | ICD-10-CM

## 2016-12-06 ENCOUNTER — Ambulatory Visit
Admission: RE | Admit: 2016-12-06 | Discharge: 2016-12-06 | Disposition: A | Discharge status: Home / Self Care | Payer: Medicare Other | Source: Ambulatory Visit | Attending: Oncology | Admitting: Oncology

## 2016-12-06 DIAGNOSIS — Z1231 Encounter for screening mammogram for malignant neoplasm of breast: Secondary | ICD-10-CM

## 2017-01-09 ENCOUNTER — Telehealth: Payer: Self-pay | Admitting: Oncology

## 2017-01-09 ENCOUNTER — Ambulatory Visit (HOSPITAL_BASED_OUTPATIENT_CLINIC_OR_DEPARTMENT_OTHER): Payer: Medicare Other | Admitting: Oncology

## 2017-01-09 VITALS — BP 130/65 | HR 71 | Temp 97.8°F | Resp 18 | Ht 65.0 in | Wt 157.3 lb

## 2017-01-09 DIAGNOSIS — C50912 Malignant neoplasm of unspecified site of left female breast: Secondary | ICD-10-CM | POA: Diagnosis present

## 2017-01-09 DIAGNOSIS — M8580 Other specified disorders of bone density and structure, unspecified site: Secondary | ICD-10-CM

## 2017-01-09 DIAGNOSIS — C50512 Malignant neoplasm of lower-outer quadrant of left female breast: Secondary | ICD-10-CM

## 2017-01-09 DIAGNOSIS — Z170 Estrogen receptor positive status [ER+]: Secondary | ICD-10-CM | POA: Diagnosis not present

## 2017-01-09 NOTE — Progress Notes (Signed)
ID: Catherine Carlson   DOB: 24-Jul-1940  MR#: [id number]  QIH#:[id number]  PCP: Birder Robson., MD GYN:  SU:  OTHER MD: Lind Guest MD, Frazier Richards MD  CHIEF COMPLAINT:  Left Breast Cancer  CURRENT TREATMENT: Anastrozole   HISTORY OF PRESENT ILLNESS:    From the original intake note:    Catherine Carlson is a 77 year old female who early in 2003, noticed approximately a pea-sized nodule in the lower outer quadrant of her left breast.  She routinely performed self-breast examinations, and this was a new finding for her. She denies any pain in the breast area, nipple discharge or bleeding. The patient had undergone a routine screening mammogram at [facility] on April 22, 2001 which showed no suspicious areas in either breast.  The patient proceeded to be seen by Dr. March Rummage, who palpated a lesion in the 4 o'clock position of the breast which was very close to the skin.  Ultrasound of this area revealed it was not a simple cyst.  In light of the above findings, the patient proceeded to undergo excisional biopsy with pathology returning invasive ductal carcinoma, Grade 2 out of 3. 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  The tumor did focally involve the superficial margin. In light of this, the patient proceeded to undergo reexcision.  In addition, at the same time, she underwent a sentinel node procedure. The patient was found to have no residual carcinoma in the reexcision.  The sentinel node specimen revealed no evidence of metastatic disease.  Her subsequent history is as detailed below   INTERVAL HISTORY: Catherine Carlson returns today for followup of her left breast cancer. She continues on anastrozole, with good tolerance. She denies any issues with hot flashes or vaginal dryness. She notes that it costs her $5 per month.   Since her last visit, she underwent routine bilateral mammography with  CAD and tomography on 12/06/2016 at [facility] showing: Breast density category C. There is no mammographic evidence of malignancy.  Her last bone density was in November 2017 and showed slightly worsening osteopenia with a T score of -1.9.   REVIEW OF SYSTEMS: Catherine Carlson reports that she has been volunteering at church and for other people. She walks a half mile down her drive way twice a day for a total of 2 miles. She denies unusual headaches, visual changes, nausea, vomiting, or dizziness. There has been no unusual cough, phlegm production, or pleurisy. This been no change in bowel or bladder habits. She denies unexplained fatigue or unexplained weight loss, bleeding, rash, or fever. A detailed review of systems was otherwise stable.   PAST MEDICAL HISTORY: Past Medical History:  Diagnosis Date  . Cancer ([place])   . Family history of breast cancer    PGM  . Hypertension   . Hypothyroidism   . Osteopenia   . Seizures ([place])   . Tremor of both hands   . Vitamin D deficiency 10/15/2011  :    The patient underwent a hysterectomy in 1973 for endometriosis.  She has a history of hypertension.  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  The patient was previously on Synthroid in the past for thyroid problems but none currently.  The patient has retained one ovary.    PAST SURGICAL HISTORY: Past Surgical History:  Procedure Laterality Date  . ABDOMINAL HYSTERECTOMY  1970  .  BREAST LUMPECTOMY  06/10/2001   Left - Dr Mertha Finders  . CATARACT EXTRACTION W/PHACO Left 07/28/2015   Procedure: CATARACT EXTRACTION PHACO AND INTRAOCULAR LENS PLACEMENT (IOC);  Surgeon: Eulogio Bear, MD;  Location: [facility] ORS;  Service: Ophthalmology;  Laterality: Left;  [place] 30.3 AP% 12.0 CDE 3.66 FLUID PACK LOT # [id number]  . EYE SURGERY    . THYROIDECTOMY      FAMILY HISTORY Family History  Problem Relation Age of Onset  . Heart disease Mother   . Heart  disease Father   . Breast cancer Maternal Grandmother   Significant for paternal grandmother with history of breast cancer, diagnosed at age 93.   There is no other family history of breast or gynecologic malignancies or other type malignancies.    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 Her husband of 32 plus years, Catherine Carlson, was "troubleshoot her" for a local company. They have an adopted son, Ron, 87+, who runs the patient's farm.  He lives on the same property and pretty much looks after married and her husband daily.  They grow tobacco, soybean's, and week. She has 2 grandsons, age 75 and 32. The patient is a Tourist information centre manager.   ADVANCED DIRECTIVES: in place  HEALTH MAINTENANCE: (Updated 10/23/2012) Social History   Tobacco Use  . Smoking status: Never Smoker  . Smokeless tobacco: Never Used  Substance Use Topics  . Alcohol use: No  . Drug use: No     Colonoscopy: 2012/[facility]  PAP: Status post hysterectomy  Bone density: July 2013, osteopenia  Lipid panel: Dr. Ronnald Ramp  Allergies  Allergen Reactions  . Morphine And Related Itching, Nausea And Vomiting and Other (See Comments)    Caused GI upset. Also caused patient to black out. Unaware of what was going on around her. Need help to move around.  . Penicillins Anaphylaxis and Hives    Hives go around the neck Has patient had a PCN reaction causing immediate rash, facial/tongue/throat swelling, SOB or lightheadedness with hypotension: Yes Has patient had a PCN reaction causing severe rash involving mucus membranes or skin necrosis: Yes Has patient had a PCN reaction that required hospitalization No Has patient had a PCN reaction occurring within the last 10 years: No If all of the above answers are "NO", then may proceed with Cephalosporin use.   . Codeine Nausea And Vomiting    Current Outpatient Medications  Medication Sig Dispense Refill  . amLODipine  (NORVASC) 10 MG tablet Take 10 mg by mouth daily.    Marland Kitchen anastrozole (ARIMIDEX) 1 MG tablet Take 1 tablet (1 mg total) by mouth daily. 90 tablet 3  . aspirin 81 MG tablet Take 81 mg by mouth daily.      . calcium citrate (CALCITRATE - DOSED IN MG ELEMENTAL CALCIUM) 950 MG tablet Take 1 tablet by mouth daily.      . carbamazepine (TEGRETOL) 200 MG tablet Take 400 mg by mouth at bedtime.     . carvedilol (COREG) 12.5 MG tablet Take 12.5 mg by mouth 2 (two) times daily with a meal.     . cholecalciferol (VITAMIN D) 1000 UNITS tablet Take 2,000 Units by mouth daily.     Marland Kitchen escitalopram (LEXAPRO) 10 MG tablet Take 10 mg by mouth daily.    Marland Kitchen levothyroxine (SYNTHROID, LEVOTHROID) 112 MCG tablet Take 112 mcg by mouth daily before breakfast.    . triamterene-hydrochlorothiazide (DYAZIDE) 37.5-25 MG capsule Take 1  capsule by mouth daily.     No current facility-administered medications for this visit.     OBJECTIVE: Elderly white woman in no acute distress Vitals:   01/09/17 0943  BP: 130/65  Pulse: 71  Resp: 18  Temp: 97.8 F (36.6 C)  SpO2: 99%     Body mass index is 26.18 kg/m.    ECOG FS: 1 Filed Weights   01/09/17 0943  Weight: 157 lb 4.8 oz (71.4 kg)   Sclerae unicteric, EOMs intact Oropharynx clear and moist No cervical or supraclavicular adenopathy Lungs no rales or rhonchi Heart regular rate and rhythm Abd soft, nontender, positive bowel sounds MSK no focal spinal tenderness, including significant palpation and percussion of the mid thoracic back which is where she has some discomfort most afternoons; no upper extremity lymphedema Neuro: nonfocal, well oriented, appropriate affect Breasts: Right breast is benign.  The left breast is undergone lumpectomy and radiation with no evidence of local recurrence.  Both axillae are benign  LAB RESULTS: Lab Results  Component Value Date   WBC 7.2 12/05/2015   NEUTROABS 5.0 12/05/2015   HGB 12.3 12/05/2015   HCT 37.7 12/05/2015   MCV  93.6 12/05/2015   PLT 221 12/05/2015      Chemistry      Component Value Date/Time   NA 140 05/02/2016 0911   K 3.8 05/02/2016 0911   CL 98 10/15/2011 1342   CO2 24 05/02/2016 0911   BUN 24.5 05/02/2016 0911   CREATININE 1.1 05/02/2016 0911      Component Value Date/Time   CALCIUM 9.9 05/02/2016 0911   ALKPHOS 89 12/05/2015 0938   AST 13 12/05/2015 0938   ALT 9 12/05/2015 0938   BILITOT 0.22 12/05/2015 0938       STUDIES: Since her last visit, she underwent routine bilateral mammography with CAD and tomography on 12/06/2016 at [facility] showing: Breast density category C. There is no mammographic evidence of malignancy.  Last bone density on 12/14/2015 showed a T-score of -1.7  ASSESSMENT: 77 y.o. [place] woman   (1)  status post left lumpectomy and sentinel lymph node biopsy June 2003 for a T2 N0 invasive ductal carcinoma, ER positive, PR and HER2 negative,   (2)  status post Cytoxan and Adriamycin times four  (3)  Received radiation therapy, completed October 2003   (4)  Arimidex started in October 2003, discontinued January 2019  (5) osteopenia, currently receiving zoledronic acid annually, with first dose on 10/26/2011, most recent dfose May 02, 2016  (a) bone density 12/14/2015 showed a T score of -1.7 (down from -1.22 years prior)   PLAN:   Catherine Carlson is now 15 years out from definitive surgery for her breast cancer with no evidence of disease recurrence.  This is very favorable.  She continues on anastrozole, but I am concerned that this may be contributing to her osteopenia despite the zolendronate she receives once a year I think it would be prudent for her to stop and we reviewed the data that shows that 7 years of anastrozole is just as good as 10.  At this point I think she is really only getting side effects from this medication  Accordingly she is going off the anastrozole.  We discussed considering tamoxifen or raloxifene but at this point I  think the best thing to do is observation alone.  We are going to continue the zolendronate on additional 2 years in hopes that this helps her bone density recover somewhat.  I  encouraged her walking program and to continue her vitamin D and calcium supplementation  She will have her next Zometa in April and her next bone density in November.  She knows to call for any problems that may develop before her next visit here. Magrinat, Virgie Dad, MD  01/09/17 10:16 AM Medical Oncology and Hematology [facility] [address] [place], [place] [address] Tel. [phone]    Fax. [phone]  This document serves as a record of services personally performed by Lurline Del, MD. It was created on his behalf by Sheron Nightingale, a trained medical scribe. The creation of this record is based on the scribe's personal observations and the provider's statements to them.    I have reviewed the above documentation for accuracy and completeness, and I agree with the above.

## 2017-01-09 NOTE — Telephone Encounter (Signed)
Gave patient AVS and calendar of upcoming January 2019/2020 appointments.

## 2017-04-29 ENCOUNTER — Other Ambulatory Visit: Payer: Self-pay | Admitting: *Deleted

## 2017-04-29 DIAGNOSIS — C50512 Malignant neoplasm of lower-outer quadrant of left female breast: Secondary | ICD-10-CM

## 2017-04-29 DIAGNOSIS — Z170 Estrogen receptor positive status [ER+]: Principal | ICD-10-CM

## 2017-05-01 ENCOUNTER — Inpatient Hospital Stay: Payer: Medicare Other

## 2017-05-01 ENCOUNTER — Inpatient Hospital Stay: Payer: Medicare Other | Attending: Oncology

## 2017-05-01 VITALS — BP 134/60 | HR 70 | Temp 97.9°F | Resp 17

## 2017-05-01 DIAGNOSIS — C50512 Malignant neoplasm of lower-outer quadrant of left female breast: Secondary | ICD-10-CM | POA: Diagnosis present

## 2017-05-01 DIAGNOSIS — M8580 Other specified disorders of bone density and structure, unspecified site: Secondary | ICD-10-CM

## 2017-05-01 DIAGNOSIS — Z170 Estrogen receptor positive status [ER+]: Secondary | ICD-10-CM | POA: Diagnosis not present

## 2017-05-01 DIAGNOSIS — E559 Vitamin D deficiency, unspecified: Secondary | ICD-10-CM

## 2017-05-01 DIAGNOSIS — Z79899 Other long term (current) drug therapy: Secondary | ICD-10-CM | POA: Insufficient documentation

## 2017-05-01 DIAGNOSIS — Z853 Personal history of malignant neoplasm of breast: Secondary | ICD-10-CM

## 2017-05-01 DIAGNOSIS — Z780 Asymptomatic menopausal state: Secondary | ICD-10-CM

## 2017-05-01 LAB — CMP (CANCER CENTER ONLY)
ALBUMIN: 3.7 g/dL (ref 3.5–5.0)
ALK PHOS: 99 U/L (ref 40–150)
ALT: 10 U/L (ref 0–55)
AST: 12 U/L (ref 5–34)
Anion gap: 11 (ref 3–11)
BUN: 25 mg/dL (ref 7–26)
CALCIUM: 10.1 mg/dL (ref 8.4–10.4)
CO2: 25 mmol/L (ref 22–29)
Chloride: 105 mmol/L (ref 98–109)
Creatinine: 1.17 mg/dL — ABNORMAL HIGH (ref 0.60–1.10)
GFR, Est AFR Am: 50 mL/min — ABNORMAL LOW (ref >60)
GFR, Estimated: 43 mL/min — ABNORMAL LOW (ref >60)
GLUCOSE: 95 mg/dL (ref 70–140)
Potassium: 3.5 mmol/L (ref 3.5–5.1)
Sodium: 141 mmol/L (ref 136–145)
TOTAL PROTEIN: 6.5 g/dL (ref 6.4–8.3)
Total Bilirubin: 0.2 mg/dL (ref 0.2–1.2)

## 2017-05-01 MED ORDER — SODIUM CHLORIDE 0.9 % IV SOLN
Freq: Once | INTRAVENOUS | Status: AC
  Administered 2017-05-01: 11:00:00 via INTRAVENOUS

## 2017-05-01 MED ORDER — ZOLEDRONIC ACID 4 MG/100ML IV SOLN
4.0000 mg | Freq: Once | INTRAVENOUS | Status: AC
  Administered 2017-05-01: 4 mg via INTRAVENOUS
  Filled 2017-05-01: qty 100

## 2017-05-01 NOTE — Patient Instructions (Signed)

## 2017-10-25 ENCOUNTER — Other Ambulatory Visit: Payer: Self-pay | Admitting: Oncology

## 2017-10-25 DIAGNOSIS — Z1231 Encounter for screening mammogram for malignant neoplasm of breast: Secondary | ICD-10-CM

## 2018-01-06 ENCOUNTER — Ambulatory Visit
Admission: RE | Admit: 2018-01-06 | Discharge: 2018-01-06 | Disposition: A | Discharge status: Home / Self Care | Payer: Medicare Other | Source: Ambulatory Visit | Attending: Oncology | Admitting: Oncology

## 2018-01-06 DIAGNOSIS — Z1231 Encounter for screening mammogram for malignant neoplasm of breast: Secondary | ICD-10-CM

## 2018-01-06 DIAGNOSIS — Z170 Estrogen receptor positive status [ER+]: Principal | ICD-10-CM

## 2018-01-06 DIAGNOSIS — C50512 Malignant neoplasm of lower-outer quadrant of left female breast: Secondary | ICD-10-CM

## 2018-01-06 HISTORY — DX: Personal history of irradiation: Z92.3

## 2018-01-06 HISTORY — DX: Malignant neoplasm of unspecified site of unspecified female breast: C50.919

## 2018-01-20 NOTE — Progress Notes (Signed)
[place]  [phone] [phone]    ID: AZHARIA SURRATT   DOB: [birth year]  MR#: [id number]  FUX#:[id number]  Patient Care Team: Birder Robson., MD as PCP - General (Internal Medicine) Deasia Chiu, Virgie Dad, MD (Hematology and Oncology) Selinda Orion, MD (Inactive) (Obstetrics and Gynecology) Gery Pray, MD as Consulting Physician (Radiation Oncology) Neldon Mc, MD as Consulting Physician (General Surgery) Holley Bouche, NP (Inactive) as Nurse Practitioner (Nurse Practitioner) Idamae Schuller, Laban Emperor, MD as Consulting Physician (Pulmonary Disease)  OTHER: Lind Guest PA (internal medicine)   CHIEF COMPLAINT:  Left Breast Cancer  CURRENT TREATMENT: Denosumab/Prolia thank you   HISTORY OF PRESENT ILLNESS:    From the original intake note:    Catherine Carlson is a 78 year old female who early in 2003, noticed approximately a pea-sized nodule in the lower outer quadrant of her left breast.  She routinely performed self-breast examinations, and this was a new finding for her. She denies any pain in the breast area, nipple discharge or bleeding. The patient had undergone a routine screening mammogram at [facility] on April 22, 2001 which showed no suspicious areas in either breast.  The patient proceeded to be seen by Dr. March Rummage, who palpated a lesion in the 4 o'clock position of the breast which was very close to the skin.  Ultrasound of this area revealed it was not a simple cyst.  In light of the above findings, the patient proceeded to undergo excisional biopsy with pathology returning invasive ductal carcinoma, Grade 2 out of 3. 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  The tumor did focally involve the superficial margin. In light of this, the patient proceeded to undergo reexcision.  In addition, at the same time, she underwent a  sentinel node procedure. The patient was found to have no residual carcinoma in the reexcision.  The sentinel node specimen revealed no evidence of metastatic disease.  Her subsequent history is as detailed below   INTERVAL HISTORY: Catherine Carlson returns today for follow-up and treatment of her left breast cancer.  She is now under observation alone  Catherine Carlson's last bone density screening on 01/06/2018, showed a T-score of -2.5, which is considered osteoporotic.  She receives zoledronate yearly with her next dose April 2020  Her last mammography was on 01/06/2018 at [place] showing Breast Density Category C. There is no mammographic evidence of malignancy.  Since her last visit, she underwent a chest Xray on 11/08/2017 showing Stable cardiomediastinal contours. Clear lungs. No pleural fluid. Hazy opacity seen overlying the hemithoraces bilaterally, likely related to external soft tissues.  She also underwent a CT angiogram with contrast at [facility] on 11/20/2017 showing normal origin and course of coronary arteries. No atherosclerotic disease and no luminal narrowing. Cad-Rads 0. Moderately enlarged left atrium and left atrial appendage with normal filling and no thrombus. Thickened mitral valve leaflets. Notable mitral annular calcifications. Noncalcified right middle lobe 4 mm pulmonary nodule. If the patient is low risk for lung cancer, no further follow-up is recommended. If the patient is high risk for lung cancer, consider 12 month follow-up CT. (2017 Fleischner Guidelines). Query mild bronchitis.  On the same day, she also underwent a chest CT without contrast showing    REVIEW OF SYSTEMS: Catherine Carlson was diagnosed with congestive heart failure in the last year at [facility] through CT scans. She feels back to normal now, but she does take  things easy. She does housework, but she has a .  The patient denies unusual headaches, visual changes, nausea, vomiting, or dizziness. There  has been no unusual cough, phlegm production, or pleurisy. This been no change in bowel or bladder habits. The patient denies unexplained fatigue or unexplained weight loss, bleeding, rash, or fever. A detailed review of systems was otherwise noncontributory.    PAST MEDICAL HISTORY: Past Medical History:  Diagnosis Date  . Breast cancer ([place]) 2003   left  . Cancer ([place])   . Family history of breast cancer    PGM  . Hypertension   . Hypothyroidism   . Osteopenia   . Personal history of radiation therapy   . Seizures ([place])   . Tremor of both hands   . Vitamin D deficiency 10/15/2011  :    The patient underwent a hysterectomy in 1973 for endometriosis.  She has a history of hypertension.  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  The patient was previously on Synthroid in the past for thyroid problems but none currently.  The patient has retained one ovary.    PAST SURGICAL HISTORY: Past Surgical History:  Procedure Laterality Date  . ABDOMINAL HYSTERECTOMY  1970  . BREAST LUMPECTOMY  06/10/2001   Left - Dr Mertha Finders  . CATARACT EXTRACTION W/PHACO Left 07/28/2015   Procedure: CATARACT EXTRACTION PHACO AND INTRAOCULAR LENS PLACEMENT (IOC);  Surgeon: Eulogio Bear, MD;  Location: [facility] ORS;  Service: Ophthalmology;  Laterality: Left;  [place] 30.3 AP% 12.0 CDE 3.66 FLUID PACK LOT # [id number]  . EYE SURGERY    . THYROIDECTOMY      FAMILY HISTORY Family History  Problem Relation Age of Onset  . Heart disease Mother   . Heart disease Father   . Breast cancer Maternal Grandmother   Significant for paternal grandmother with history of breast cancer, diagnosed at age 18.   There is no other family history of breast or gynecologic malignancies or other type malignancies.    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 Her husband of 81 plus years, [name], was  "troubleshooter" for a [employer]. They have an adopted son, Catherine Carlson, 23+, who runs the patient's farm.  He lives on the same property and pretty much looks after married and her husband daily.  They grow tobacco, soybean's, and week. She has 2 grandsons, age 1 and 44. The patient is a Tourist information centre manager.   ADVANCED DIRECTIVES: in place  HEALTH MAINTENANCE: (Updated 10/23/2012) Social History   Tobacco Use  . Smoking status: Never Smoker  . Smokeless tobacco: Never Used  Substance Use Topics  . Alcohol use: No  . Drug use: No     Colonoscopy: 2012/[facility]  PAP: Status post hysterectomy  Bone density: July 2013, osteopenia  Lipid panel: Dr. Ronnald Ramp  Allergies  Allergen Reactions  . Morphine And Related Itching, Nausea And Vomiting and Other (See Comments)    Caused GI upset. Also caused patient to black out. Unaware of what was going on around her. Need help to move around.  . Penicillins Anaphylaxis and Hives    Hives go around the neck Has patient had a PCN reaction causing immediate rash, facial/tongue/throat swelling, SOB or lightheadedness with hypotension: Yes Has patient had a PCN reaction causing severe rash involving mucus membranes or skin necrosis: Yes Has patient had a PCN reaction  that required hospitalization No Has patient had a PCN reaction occurring within the last 10 years: No If all of the above answers are "NO", then may proceed with Cephalosporin use.   . Codeine Nausea And Vomiting    Current Outpatient Medications  Medication Sig Dispense Refill  . amLODipine (NORVASC) 10 MG tablet Take 10 mg by mouth daily.    Catherine Carlson Kitchen aspirin 81 MG tablet Take 81 mg by mouth daily.      . calcium citrate (CALCITRATE - DOSED IN MG ELEMENTAL CALCIUM) 950 MG tablet Take 1 tablet by mouth daily.      . carbamazepine (TEGRETOL) 200 MG tablet Take 400 mg by mouth at bedtime.     . carvedilol (COREG) 12.5 MG tablet Take 12.5 mg by mouth 2 (two) times daily with a meal.     . cholecalciferol  (VITAMIN D) 1000 UNITS tablet Take 2,000 Units by mouth daily.     Catherine Carlson Kitchen escitalopram (LEXAPRO) 10 MG tablet Take 10 mg by mouth daily.    Catherine Carlson Kitchen levothyroxine (SYNTHROID, LEVOTHROID) 112 MCG tablet Take 112 mcg by mouth daily before breakfast.    . triamterene-hydrochlorothiazide (DYAZIDE) 37.5-25 MG capsule Take 1 capsule by mouth daily.     No current facility-administered medications for this visit.     OBJECTIVE: Elderly white woman who appears stated age  44:   01/22/18 0955  BP: 130/80  Pulse: 91  Resp: 18  Temp: 98.6 F (37 C)  SpO2: 99%     Body mass index is 24.21 kg/m.    ECOG FS: 1 Filed Weights   01/22/18 0955  Weight: 145 lb 8 oz (66 kg)   Sclerae unicteric, pupils round and equal Oropharynx clear and moist, teeth in good repair No cervical or supraclavicular adenopathy Lungs no rales or rhonchi Heart regular rate and rhythm Abd soft, nontender, positive bowel sounds MSK no focal spinal tenderness, no upper extremity lymphedema Neuro: nonfocal, well oriented, appropriate affect Breasts: The right breast is unremarkable.  The left breast is status post lumpectomy followed by radiation.  There is no evidence of disease recurrence.  Both axillae are benign.  LAB RESULTS: Lab Results  Component Value Date   WBC 8.2 01/22/2018   NEUTROABS 5.4 01/22/2018   HGB 12.9 01/22/2018   HCT 40.1 01/22/2018   MCV 95.9 01/22/2018   PLT 217 01/22/2018      Chemistry      Component Value Date/Time   NA 141 01/22/2018 0933   NA 140 05/02/2016 0911   K 4.6 01/22/2018 0933   K 3.8 05/02/2016 0911   CL 102 01/22/2018 0933   CL 98 10/15/2011 1342   CO2 30 01/22/2018 0933   CO2 24 05/02/2016 0911   BUN 23 01/22/2018 0933   BUN 24.5 05/02/2016 0911   CREATININE 1.26 (H) 01/22/2018 0933   CREATININE 1.1 05/02/2016 0911      Component Value Date/Time   CALCIUM 9.9 01/22/2018 0933   CALCIUM 9.9 05/02/2016 0911   ALKPHOS 94 01/22/2018 0933   ALKPHOS 89 12/05/2015 0938    AST 14 (L) 01/22/2018 0933   AST 13 12/05/2015 0938   ALT 13 01/22/2018 0933   ALT 9 12/05/2015 0938   BILITOT 0.4 01/22/2018 0933   BILITOT 0.22 12/05/2015 0938       STUDIES: Dg Bone Density  Result Date: 01/06/2018 EXAM: DUAL X-RAY ABSORPTIOMETRY (DXA) FOR BONE MINERAL DENSITY IMPRESSION: Referring Physician:  Chauncey Cruel Your patient completed a BMD test using Lunar  IDXA DXA system ( analysis version: 16 ) manufactured by [employer]. Technologist:AW PATIENT: Name: Catherine Carlson, Catherine Carlson Patient ID: [id number] Birth Date: 1940-08-01 Height: 62.5 in. Sex: Female Measured: 01/06/2018 Weight: 147.8 lbs. Indications: Advanced Age, Arimidex, Breast Cancer History, Caucasian, Estrogen Deficient, Height Loss (781.91), History of Osteopenia, Lexapro, One ovary removed, Postmenopausal, scoliosis, Secondary Osteoporosis, Synthroid, Tegretol Fractures: Ankle Treatments: Calcium (E943.0), Vitamin D (E933.5) ASSESSMENT: The BMD measured at Forearm Radius 33% is 0.663 g/cm2 with a T-score of -2.5. This patient is considered osteoporotic according to [facility]) criteria. There has been a statistically significant decrease in BMD of Total mean since prior exam dated 12/14/2015. The scan quality is good. Lumbar spine was not utilized due to advanced degenerative changes. Site Region Measured Date Measured Age YA BMD Significant CHANGE T-score Left Forearm Radius 33% 01/06/2018 79.3 -2.5 0.663 g/cm2 DualFemur Neck Right 01/06/2018 79.3 -1.4 0.838 g/cm2 DualFemur Neck Right 12/14/2015 77.2 -1.6 0.822 g/cm2 DualFemur Total Mean 01/06/2018 79.3 -1.0 0.877 g/cm2 * DualFemur Total Mean 12/14/2015 77.2 -0.8 0.908 g/cm2 World Health Organization [facility]) criteria for post-menopausal, Caucasian Women: Normal       T-score at or above -1 SD Osteopenia   T-score between -1 and -2.5 SD Osteoporosis T-score at or below -2.5 SD RECOMMENDATION: 1. All patients should optimize calcium and vitamin D intake. 2.  Consider FDA approved medical therapies in postmenopausal women and men aged 4 years and older, based on the following: a. A hip or vertebral (clinical or morphometric) fracture b. T- score < or = -2.5 at the femoral neck or spine after appropriate evaluation to exclude secondary causes c. Low bone mass (T-score between -1.0 and -2.5 at the femoral neck or spine) and a 10 year probability of a hip fracture > or = 3% or a 10 year probability of a major osteoporosis-related fracture > or = 20% based on the [place]-adapted WHO algorithm d. Clinician judgment and/or patient preferences may indicate treatment for people with 10-year fracture probabilities above or below these levels FOLLOW-UP: People with diagnosed cases of osteoporosis or at high risk for fracture should have regular bone mineral density tests. For patients eligible for Medicare, routine testing is allowed once every 2 years. The testing frequency can be increased to one year for patients who have rapidly progressing disease, those who are receiving or discontinuing medical therapy to restore bone mass, or have additional risk factors. I have reviewed this report and agree with the above findings. [facility] Radiology Electronically Signed   By: Rolm Baptise M.D.   On: 01/06/2018 13:11   Mm 3d Screen Breast Bilateral  Result Date: 01/06/2018 CLINICAL DATA:  Screening. EXAM: DIGITAL SCREENING BILATERAL MAMMOGRAM WITH TOMO AND CAD COMPARISON:  Previous exam(s). ACR Breast Density Category c: The breast tissue is heterogeneously dense, which may obscure small masses. FINDINGS: There are no findings suspicious for malignancy. Images were processed with CAD. IMPRESSION: No mammographic evidence of malignancy. A result letter of this screening mammogram will be mailed directly to the patient. RECOMMENDATION: Screening mammogram in one year. (Code:SM-B-01Y) BI-RADS CATEGORY  1: Negative. Electronically Signed   By: Dorise Bullion III M.D   On: 01/06/2018  16:06     ASSESSMENT: 78 y.o. [place] woman   (1)  status post left lumpectomy and sentinel lymph node biopsy June 2003 for a T2 N0 invasive ductal carcinoma, ER positive, PR and HER2 negative,   (2)  status post Cytoxan and Adriamycin times four  (3)  Received radiation therapy,  completed October 2003   (4)  Arimidex started in October 2003, discontinued January 2019  (5) osteopenia, currently receiving zoledronic acid annually, with first dose on 10/26/2011  (a) bone density 12/14/2015 showed a T score of -1.7 (down from -1.22 years prior)  (b) repeat bone density 01/06/2018 shows a T score of -2.5 (osteoporosis  (c) switched to denosumab/Prolia beginning April 2020   PLAN:  Jarica is now 16 years out from definitive surgery for her breast cancer with no evidence of disease recurrence.  This is very favorable.  She tells me she feels reassured by coming here once a year and we are glad to accommodate that.  We reviewed her congestive heart failure story and I am delighted she is now back to baseline.  She has excellent follow-up through [facility] cardiology [facility] at [facility]  Her bone density however is worse.  His alendronate did not cause her any problems but it also did not solve the problem.  Today we talked about switching to denosumab/Prolia.  She has a good understanding of the possible toxicity side effects and complications of this agent including the rare possibility of osteonecrosis of the jaw.  She does go to her dentist on a once a year basis and there have been no issues so far  Accordingly we are stopping the anastrozole.  She will start Prolia April and receive a second dose in October.  She will see me again a year from now.  She knows to call for any other issues that may develop before then.   Catherine Carlson, Virgie Dad, MD  01/22/18 10:37 AM Medical Oncology and Hematology [facility] [address] [place], [place] [address] Tel. [phone]    Fax.  [phone]   I, Jacqualyn Posey am acting as a Education administrator for Chauncey Cruel, MD.   I, Lurline Del MD, have reviewed the above documentation for accuracy and completeness, and I agree with the above.

## 2018-01-21 ENCOUNTER — Other Ambulatory Visit: Payer: Self-pay

## 2018-01-21 DIAGNOSIS — C50512 Malignant neoplasm of lower-outer quadrant of left female breast: Secondary | ICD-10-CM

## 2018-01-21 DIAGNOSIS — Z170 Estrogen receptor positive status [ER+]: Principal | ICD-10-CM

## 2018-01-22 ENCOUNTER — Inpatient Hospital Stay (HOSPITAL_BASED_OUTPATIENT_CLINIC_OR_DEPARTMENT_OTHER): Payer: Medicare Other | Admitting: Oncology

## 2018-01-22 ENCOUNTER — Telehealth: Payer: Self-pay | Admitting: Oncology

## 2018-01-22 ENCOUNTER — Inpatient Hospital Stay: Payer: Medicare Other | Attending: Oncology

## 2018-01-22 VITALS — BP 130/80 | HR 91 | Temp 98.6°F | Resp 18 | Wt 145.5 lb

## 2018-01-22 DIAGNOSIS — Z170 Estrogen receptor positive status [ER+]: Secondary | ICD-10-CM | POA: Insufficient documentation

## 2018-01-22 DIAGNOSIS — E559 Vitamin D deficiency, unspecified: Secondary | ICD-10-CM | POA: Diagnosis not present

## 2018-01-22 DIAGNOSIS — Z79899 Other long term (current) drug therapy: Secondary | ICD-10-CM

## 2018-01-22 DIAGNOSIS — E039 Hypothyroidism, unspecified: Secondary | ICD-10-CM | POA: Insufficient documentation

## 2018-01-22 DIAGNOSIS — C50512 Malignant neoplasm of lower-outer quadrant of left female breast: Secondary | ICD-10-CM

## 2018-01-22 DIAGNOSIS — Z7982 Long term (current) use of aspirin: Secondary | ICD-10-CM

## 2018-01-22 DIAGNOSIS — M8580 Other specified disorders of bone density and structure, unspecified site: Secondary | ICD-10-CM

## 2018-01-22 DIAGNOSIS — R911 Solitary pulmonary nodule: Secondary | ICD-10-CM

## 2018-01-22 DIAGNOSIS — I5021 Acute systolic (congestive) heart failure: Secondary | ICD-10-CM

## 2018-01-22 DIAGNOSIS — M818 Other osteoporosis without current pathological fracture: Secondary | ICD-10-CM

## 2018-01-22 DIAGNOSIS — Z923 Personal history of irradiation: Secondary | ICD-10-CM

## 2018-01-22 DIAGNOSIS — M810 Age-related osteoporosis without current pathological fracture: Secondary | ICD-10-CM | POA: Insufficient documentation

## 2018-01-22 DIAGNOSIS — I509 Heart failure, unspecified: Secondary | ICD-10-CM | POA: Diagnosis not present

## 2018-01-22 DIAGNOSIS — G40909 Epilepsy, unspecified, not intractable, without status epilepticus: Secondary | ICD-10-CM

## 2018-01-22 DIAGNOSIS — I10 Essential (primary) hypertension: Secondary | ICD-10-CM | POA: Diagnosis not present

## 2018-01-22 DIAGNOSIS — Z803 Family history of malignant neoplasm of breast: Secondary | ICD-10-CM | POA: Insufficient documentation

## 2018-01-22 LAB — CMP (CANCER CENTER ONLY)
ALT: 13 U/L (ref 0–44)
AST: 14 U/L — ABNORMAL LOW (ref 15–41)
Albumin: 4 g/dL (ref 3.5–5.0)
Alkaline Phosphatase: 94 U/L (ref 38–126)
Anion gap: 9 (ref 5–15)
BUN: 23 mg/dL (ref 8–23)
CO2: 30 mmol/L (ref 22–32)
CREATININE: 1.26 mg/dL — AB (ref 0.44–1.00)
Calcium: 9.9 mg/dL (ref 8.9–10.3)
Chloride: 102 mmol/L (ref 98–111)
GFR, Est AFR Am: 47 mL/min — ABNORMAL LOW (ref >60)
GFR, Estimated: 40 mL/min — ABNORMAL LOW (ref >60)
Glucose, Bld: 98 mg/dL (ref 70–99)
Potassium: 4.6 mmol/L (ref 3.5–5.1)
Sodium: 141 mmol/L (ref 135–145)
Total Bilirubin: 0.4 mg/dL (ref 0.3–1.2)
Total Protein: 6.9 g/dL (ref 6.5–8.1)

## 2018-01-22 LAB — CBC WITH DIFFERENTIAL (CANCER CENTER ONLY)
Abs Immature Granulocytes: 0.03 10*3/uL (ref 0.00–0.07)
Basophils Absolute: 0 10*3/uL (ref 0.0–0.1)
Basophils Relative: 0 %
EOS PCT: 1 %
Eosinophils Absolute: 0.1 10*3/uL (ref 0.0–0.5)
HEMATOCRIT: 40.1 % (ref 36.0–46.0)
HEMOGLOBIN: 12.9 g/dL (ref 12.0–15.0)
Immature Granulocytes: 0 %
LYMPHS PCT: 23 %
Lymphs Abs: 1.9 10*3/uL (ref 0.7–4.0)
MCH: 30.9 pg (ref 26.0–34.0)
MCHC: 32.2 g/dL (ref 30.0–36.0)
MCV: 95.9 fL (ref 80.0–100.0)
Monocytes Absolute: 0.8 10*3/uL (ref 0.1–1.0)
Monocytes Relative: 10 %
Neutro Abs: 5.4 10*3/uL (ref 1.7–7.7)
Neutrophils Relative %: 66 %
Platelet Count: 217 10*3/uL (ref 150–400)
RBC: 4.18 MIL/uL (ref 3.87–5.11)
RDW: 12.6 % (ref 11.5–15.5)
WBC Count: 8.2 10*3/uL (ref 4.0–10.5)
nRBC: 0 % (ref 0.0–0.2)

## 2018-01-22 NOTE — Telephone Encounter (Signed)
Printed calendar and avs. °

## 2018-04-23 ENCOUNTER — Inpatient Hospital Stay: Payer: Medicare Other

## 2018-06-18 ENCOUNTER — Other Ambulatory Visit: Payer: Self-pay

## 2018-06-18 ENCOUNTER — Inpatient Hospital Stay: Payer: Medicare Other

## 2018-06-18 ENCOUNTER — Inpatient Hospital Stay: Payer: Medicare Other | Attending: Oncology

## 2018-06-18 VITALS — BP 149/66 | HR 64 | Temp 98.5°F | Resp 16 | Ht 65.0 in | Wt 137.5 lb

## 2018-06-18 DIAGNOSIS — E559 Vitamin D deficiency, unspecified: Secondary | ICD-10-CM

## 2018-06-18 DIAGNOSIS — Z79899 Other long term (current) drug therapy: Secondary | ICD-10-CM | POA: Diagnosis not present

## 2018-06-18 DIAGNOSIS — Z170 Estrogen receptor positive status [ER+]: Secondary | ICD-10-CM | POA: Diagnosis not present

## 2018-06-18 DIAGNOSIS — M8580 Other specified disorders of bone density and structure, unspecified site: Secondary | ICD-10-CM

## 2018-06-18 DIAGNOSIS — C50512 Malignant neoplasm of lower-outer quadrant of left female breast: Secondary | ICD-10-CM | POA: Insufficient documentation

## 2018-06-18 DIAGNOSIS — M818 Other osteoporosis without current pathological fracture: Secondary | ICD-10-CM

## 2018-06-18 DIAGNOSIS — Z780 Asymptomatic menopausal state: Secondary | ICD-10-CM

## 2018-06-18 LAB — CMP (CANCER CENTER ONLY)
ALT: 11 U/L (ref 0–44)
AST: 18 U/L (ref 15–41)
Albumin: 3.9 g/dL (ref 3.5–5.0)
Alkaline Phosphatase: 87 U/L (ref 38–126)
Anion gap: 12 (ref 5–15)
BUN: 22 mg/dL (ref 8–23)
CO2: 26 mmol/L (ref 22–32)
Calcium: 9.6 mg/dL (ref 8.9–10.3)
Chloride: 95 mmol/L — ABNORMAL LOW (ref 98–111)
Creatinine: 1.39 mg/dL — ABNORMAL HIGH (ref 0.44–1.00)
GFR, Est AFR Am: 42 mL/min — ABNORMAL LOW (ref >60)
GFR, Estimated: 36 mL/min — ABNORMAL LOW (ref >60)
Glucose, Bld: 100 mg/dL — ABNORMAL HIGH (ref 70–99)
Potassium: 3.3 mmol/L — ABNORMAL LOW (ref 3.5–5.1)
Sodium: 133 mmol/L — ABNORMAL LOW (ref 135–145)
Total Bilirubin: 0.3 mg/dL (ref 0.3–1.2)
Total Protein: 6.6 g/dL (ref 6.5–8.1)

## 2018-06-18 LAB — CBC WITH DIFFERENTIAL (CANCER CENTER ONLY)
Abs Immature Granulocytes: 0.04 10*3/uL (ref 0.00–0.07)
Basophils Absolute: 0 10*3/uL (ref 0.0–0.1)
Basophils Relative: 0 %
Eosinophils Absolute: 0.1 10*3/uL (ref 0.0–0.5)
Eosinophils Relative: 2 %
HCT: 38.9 % (ref 36.0–46.0)
Hemoglobin: 12.9 g/dL (ref 12.0–15.0)
Immature Granulocytes: 1 %
Lymphocytes Relative: 20 %
Lymphs Abs: 1.5 10*3/uL (ref 0.7–4.0)
MCH: 30.4 pg (ref 26.0–34.0)
MCHC: 33.2 g/dL (ref 30.0–36.0)
MCV: 91.7 fL (ref 80.0–100.0)
Monocytes Absolute: 0.8 10*3/uL (ref 0.1–1.0)
Monocytes Relative: 11 %
Neutro Abs: 4.8 10*3/uL (ref 1.7–7.7)
Neutrophils Relative %: 66 %
Platelet Count: 226 10*3/uL (ref 150–400)
RBC: 4.24 MIL/uL (ref 3.87–5.11)
RDW: 12.6 % (ref 11.5–15.5)
WBC Count: 7.3 10*3/uL (ref 4.0–10.5)
nRBC: 0 % (ref 0.0–0.2)

## 2018-06-18 MED ORDER — DENOSUMAB 60 MG/ML ~~LOC~~ SOSY
60.0000 mg | PREFILLED_SYRINGE | Freq: Once | SUBCUTANEOUS | Status: AC
  Administered 2018-06-18: 60 mg via SUBCUTANEOUS

## 2018-06-18 MED ORDER — DENOSUMAB 60 MG/ML ~~LOC~~ SOSY
PREFILLED_SYRINGE | SUBCUTANEOUS | Status: AC
  Filled 2018-06-18: qty 1

## 2018-06-18 NOTE — Progress Notes (Signed)
Pt to see dentist this afternoon for temporary cap and plan is for dental implant in 2-3 months. Per Dr Jana Hakim OK to proceed with Prolia today.

## 2018-06-18 NOTE — Patient Instructions (Signed)
Denosumab injection What is this medicine? DENOSUMAB (den oh sue mab) slows bone breakdown. Prolia is used to treat osteoporosis in women after menopause and in men, and in people who are taking corticosteroids for 6 months or more. Xgeva is used to treat a high calcium level due to cancer and to prevent bone fractures and other bone problems caused by multiple myeloma or cancer bone metastases. Xgeva is also used to treat giant cell tumor of the bone. This medicine may be used for other purposes; ask your health care provider or pharmacist if you have questions. COMMON BRAND NAME(S): Prolia, XGEVA What should I tell my health care provider before I take this medicine? They need to know if you have any of these conditions: -dental disease -having surgery or tooth extraction -infection -kidney disease -low levels of calcium or Vitamin D in the blood -malnutrition -on hemodialysis -skin conditions or sensitivity -thyroid or parathyroid disease -an unusual reaction to denosumab, other medicines, foods, dyes, or preservatives -pregnant or trying to get pregnant -breast-feeding How should I use this medicine? This medicine is for injection under the skin. It is given by a health care professional in a hospital or clinic setting. A special MedGuide will be given to you before each treatment. Be sure to read this information carefully each time. For Prolia, talk to your pediatrician regarding the use of this medicine in children. Special care may be needed. For Xgeva, talk to your pediatrician regarding the use of this medicine in children. While this drug may be prescribed for children as young as 13 years for selected conditions, precautions do apply. Overdosage: If you think you have taken too much of this medicine contact a poison control center or emergency room at once. NOTE: This medicine is only for you. Do not share this medicine with others. What if I miss a dose? It is important not to  miss your dose. Call your doctor or health care professional if you are unable to keep an appointment. What may interact with this medicine? Do not take this medicine with any of the following medications: -other medicines containing denosumab This medicine may also interact with the following medications: -medicines that lower your chance of fighting infection -steroid medicines like prednisone or cortisone This list may not describe all possible interactions. Give your health care provider a list of all the medicines, herbs, non-prescription drugs, or dietary supplements you use. Also tell them if you smoke, drink alcohol, or use illegal drugs. Some items may interact with your medicine. What should I watch for while using this medicine? Visit your doctor or health care professional for regular checks on your progress. Your doctor or health care professional may order blood tests and other tests to see how you are doing. Call your doctor or health care professional for advice if you get a fever, chills or sore throat, or other symptoms of a cold or flu. Do not treat yourself. This drug may decrease your body's ability to fight infection. Try to avoid being around people who are sick. You should make sure you get enough calcium and vitamin D while you are taking this medicine, unless your doctor tells you not to. Discuss the foods you eat and the vitamins you take with your health care professional. See your dentist regularly. Brush and floss your teeth as directed. Before you have any dental work done, tell your dentist you are receiving this medicine. Do not become pregnant while taking this medicine or for 5 months   after stopping it. Talk with your doctor or health care professional about your birth control options while taking this medicine. Women should inform their doctor if they wish to become pregnant or think they might be pregnant. There is a potential for serious side effects to an unborn  child. Talk to your health care professional or pharmacist for more information. What side effects may I notice from receiving this medicine? Side effects that you should report to your doctor or health care professional as soon as possible: -allergic reactions like skin rash, itching or hives, swelling of the face, lips, or tongue -bone pain -breathing problems -dizziness -jaw pain, especially after dental work -redness, blistering, peeling of the skin -signs and symptoms of infection like fever or chills; cough; sore throat; pain or trouble passing urine -signs of low calcium like fast heartbeat, muscle cramps or muscle pain; pain, tingling, numbness in the hands or feet; seizures -unusual bleeding or bruising -unusually weak or tired Side effects that usually do not require medical attention (report to your doctor or health care professional if they continue or are bothersome): -constipation -diarrhea -headache -joint pain -loss of appetite -muscle pain -runny nose -tiredness -upset stomach This list may not describe all possible side effects. Call your doctor for medical advice about side effects. You may report side effects to FDA at 1-800-FDA-1088. Where should I keep my medicine? This medicine is only given in a clinic, doctor's office, or other health care setting and will not be stored at home. NOTE: This sheet is a summary. It may not cover all possible information. If you have questions about this medicine, talk to your doctor, pharmacist, or health care provider.  2019 Elsevier/Gold Standard (2017-05-03 16:10:44)  Coronavirus (COVID-19) Are you at risk?  Are you at risk for the Coronavirus (COVID-19)?  To be considered HIGH RISK for Coronavirus (COVID-19), you have to meet the following criteria:  . Traveled to [place], [place], [place], [place] or [place]; or in the [place] to [employer], [place], [place], or [place]; and have fever, cough, and shortness of  breath within the last 2 weeks of travel OR . Been in close contact with a person diagnosed with COVID-19 within the last 2 weeks and have fever, cough, and shortness of breath . IF YOU DO NOT MEET THESE CRITERIA, YOU ARE CONSIDERED LOW RISK FOR COVID-19.  What to do if you are HIGH RISK for COVID-19?  Marland Kitchen If you are having a medical emergency, call 911. . Seek medical care right away. Before you go to a doctor's office, urgent care or emergency department, call ahead and tell them about your recent travel, contact with someone diagnosed with COVID-19, and your symptoms. You should receive instructions from your physician's office regarding next steps of care.  . When you arrive at healthcare provider, tell the healthcare staff immediately you have returned from visiting [place], [place], [place], [place] or [place]; or traveled in the [place] to [place], [place], [place], or [place]; in the last two weeks or you have been in close contact with a person diagnosed with COVID-19 in the last 2 weeks.   . Tell the health care staff about your symptoms: fever, cough and shortness of breath. . After you have been seen by a medical provider, you will be either: o Tested for (COVID-19) and discharged home on quarantine except to seek medical care if symptoms worsen, and asked to  - Stay home and avoid contact with  others until you get your results (4-5 days)  - Avoid travel on public transportation if possible (such as bus, train, or airplane) or o Sent to the Emergency Department by EMS for evaluation, COVID-19 testing, and possible admission depending on your condition and test results.  What to do if you are LOW RISK for COVID-19?  Reduce your risk of any infection by using the same precautions used for avoiding the common cold or flu:  Marland Kitchen Wash your hands often with soap and warm water for at least 20 seconds.  If soap and water are not readily available, use an alcohol-based hand sanitizer  with at least 60% alcohol.  . If coughing or sneezing, cover your mouth and nose by coughing or sneezing into the elbow areas of your shirt or coat, into a tissue or into your sleeve (not your hands). . Avoid shaking hands with others and consider head nods or verbal greetings only. . Avoid touching your eyes, nose, or mouth with unwashed hands.  . Avoid close contact with people who are sick. . Avoid places or events with large numbers of people in one location, like concerts or sporting events. . Carefully consider travel plans you have or are making. . If you are planning any travel outside or inside the [place], visit the CDC's Travelers' Health webpage for the latest health notices. . If you have some symptoms but not all symptoms, continue to monitor at home and seek medical attention if your symptoms worsen. . If you are having a medical emergency, call 911.   [place] / e-Visit: [email or web address]         [facility]: [facility]: [phone]                   [facility]: [phone]

## 2018-10-21 ENCOUNTER — Telehealth: Payer: Self-pay | Admitting: Oncology

## 2018-10-21 NOTE — Telephone Encounter (Signed)
Returned patient's phone call regarding rescheduling an appointment, per patient's request appointment has moved from 10/15 to 10/19.

## 2018-10-23 ENCOUNTER — Inpatient Hospital Stay: Payer: Medicare Other

## 2018-10-24 ENCOUNTER — Other Ambulatory Visit: Payer: Self-pay

## 2018-10-24 DIAGNOSIS — C50512 Malignant neoplasm of lower-outer quadrant of left female breast: Secondary | ICD-10-CM

## 2018-10-24 DIAGNOSIS — Z170 Estrogen receptor positive status [ER+]: Secondary | ICD-10-CM

## 2018-10-27 ENCOUNTER — Inpatient Hospital Stay: Payer: Medicare Other | Attending: Oncology

## 2018-10-27 ENCOUNTER — Inpatient Hospital Stay: Payer: Medicare Other

## 2018-10-27 ENCOUNTER — Telehealth: Payer: Self-pay | Admitting: Oncology

## 2018-10-27 ENCOUNTER — Other Ambulatory Visit: Payer: Self-pay

## 2018-10-27 VITALS — BP 140/65 | HR 53 | Temp 98.1°F | Resp 18

## 2018-10-27 DIAGNOSIS — C50512 Malignant neoplasm of lower-outer quadrant of left female breast: Secondary | ICD-10-CM | POA: Insufficient documentation

## 2018-10-27 DIAGNOSIS — M8580 Other specified disorders of bone density and structure, unspecified site: Secondary | ICD-10-CM

## 2018-10-27 DIAGNOSIS — Z170 Estrogen receptor positive status [ER+]: Secondary | ICD-10-CM | POA: Insufficient documentation

## 2018-10-27 DIAGNOSIS — E559 Vitamin D deficiency, unspecified: Secondary | ICD-10-CM

## 2018-10-27 DIAGNOSIS — Z780 Asymptomatic menopausal state: Secondary | ICD-10-CM

## 2018-10-27 LAB — CBC WITH DIFFERENTIAL (CANCER CENTER ONLY)
Abs Immature Granulocytes: 0.04 10*3/uL (ref 0.00–0.07)
Basophils Absolute: 0 10*3/uL (ref 0.0–0.1)
Basophils Relative: 0 %
Eosinophils Absolute: 0.1 10*3/uL (ref 0.0–0.5)
Eosinophils Relative: 1 %
HCT: 35 % — ABNORMAL LOW (ref 36.0–46.0)
Hemoglobin: 11.8 g/dL — ABNORMAL LOW (ref 12.0–15.0)
Immature Granulocytes: 0 %
Lymphocytes Relative: 16 %
Lymphs Abs: 1.5 10*3/uL (ref 0.7–4.0)
MCH: 30.1 pg (ref 26.0–34.0)
MCHC: 33.7 g/dL (ref 30.0–36.0)
MCV: 89.3 fL (ref 80.0–100.0)
Monocytes Absolute: 1 10*3/uL (ref 0.1–1.0)
Monocytes Relative: 10 %
Neutro Abs: 6.9 10*3/uL (ref 1.7–7.7)
Neutrophils Relative %: 73 %
Platelet Count: 227 10*3/uL (ref 150–400)
RBC: 3.92 MIL/uL (ref 3.87–5.11)
RDW: 12.4 % (ref 11.5–15.5)
WBC Count: 9.5 10*3/uL (ref 4.0–10.5)
nRBC: 0 % (ref 0.0–0.2)

## 2018-10-27 LAB — CMP (CANCER CENTER ONLY)
ALT: 16 U/L (ref 0–44)
AST: 19 U/L (ref 15–41)
Albumin: 3.8 g/dL (ref 3.5–5.0)
Alkaline Phosphatase: 83 U/L (ref 38–126)
Anion gap: 11 (ref 5–15)
BUN: 24 mg/dL — ABNORMAL HIGH (ref 8–23)
CO2: 28 mmol/L (ref 22–32)
Calcium: 9.1 mg/dL (ref 8.9–10.3)
Chloride: 89 mmol/L — ABNORMAL LOW (ref 98–111)
Creatinine: 1.19 mg/dL — ABNORMAL HIGH (ref 0.44–1.00)
GFR, Est AFR Am: 50 mL/min — ABNORMAL LOW (ref >60)
GFR, Estimated: 43 mL/min — ABNORMAL LOW (ref >60)
Glucose, Bld: 91 mg/dL (ref 70–99)
Potassium: 3.8 mmol/L (ref 3.5–5.1)
Sodium: 128 mmol/L — ABNORMAL LOW (ref 135–145)
Total Bilirubin: 0.2 mg/dL — ABNORMAL LOW (ref 0.3–1.2)
Total Protein: 6.3 g/dL — ABNORMAL LOW (ref 6.5–8.1)

## 2018-10-27 MED ORDER — DENOSUMAB 60 MG/ML ~~LOC~~ SOSY
PREFILLED_SYRINGE | SUBCUTANEOUS | Status: AC
  Filled 2018-10-27: qty 1

## 2018-10-27 MED ORDER — DENOSUMAB 60 MG/ML ~~LOC~~ SOSY: 60.0000 mg | PREFILLED_SYRINGE | Freq: Once | SUBCUTANEOUS | Status: DC

## 2018-10-27 NOTE — Telephone Encounter (Signed)
Scheduled appt per 10/19 sch message - unable to reach pt. Left message with appt date and time

## 2018-10-27 NOTE — Addendum Note (Signed)
Addended by: Reyne Dumas E on: 10/27/2018 01:52 PM   Modules accepted: Orders

## 2018-11-05 ENCOUNTER — Other Ambulatory Visit: Payer: Self-pay | Admitting: Oncology

## 2018-11-05 DIAGNOSIS — Z1231 Encounter for screening mammogram for malignant neoplasm of breast: Secondary | ICD-10-CM

## 2018-12-19 ENCOUNTER — Other Ambulatory Visit: Payer: Self-pay | Admitting: *Deleted

## 2018-12-19 DIAGNOSIS — Z170 Estrogen receptor positive status [ER+]: Secondary | ICD-10-CM

## 2018-12-19 DIAGNOSIS — C50512 Malignant neoplasm of lower-outer quadrant of left female breast: Secondary | ICD-10-CM

## 2018-12-22 ENCOUNTER — Inpatient Hospital Stay: Payer: Medicare Other | Attending: Oncology

## 2018-12-22 ENCOUNTER — Other Ambulatory Visit: Payer: Self-pay

## 2018-12-22 ENCOUNTER — Inpatient Hospital Stay: Payer: Medicare Other

## 2018-12-22 VITALS — BP 140/72 | HR 62 | Temp 98.7°F | Resp 18

## 2018-12-22 DIAGNOSIS — M810 Age-related osteoporosis without current pathological fracture: Secondary | ICD-10-CM | POA: Diagnosis present

## 2018-12-22 DIAGNOSIS — Z853 Personal history of malignant neoplasm of breast: Secondary | ICD-10-CM | POA: Diagnosis not present

## 2018-12-22 DIAGNOSIS — M8580 Other specified disorders of bone density and structure, unspecified site: Secondary | ICD-10-CM

## 2018-12-22 DIAGNOSIS — C50512 Malignant neoplasm of lower-outer quadrant of left female breast: Secondary | ICD-10-CM

## 2018-12-22 DIAGNOSIS — E559 Vitamin D deficiency, unspecified: Secondary | ICD-10-CM

## 2018-12-22 DIAGNOSIS — Z170 Estrogen receptor positive status [ER+]: Secondary | ICD-10-CM

## 2018-12-22 DIAGNOSIS — Z780 Asymptomatic menopausal state: Secondary | ICD-10-CM

## 2018-12-22 LAB — CBC WITH DIFFERENTIAL (CANCER CENTER ONLY)
Abs Immature Granulocytes: 0.02 10*3/uL (ref 0.00–0.07)
Basophils Absolute: 0 10*3/uL (ref 0.0–0.1)
Basophils Relative: 0 %
Eosinophils Absolute: 0.1 10*3/uL (ref 0.0–0.5)
Eosinophils Relative: 2 %
HCT: 35.8 % — ABNORMAL LOW (ref 36.0–46.0)
Hemoglobin: 11.3 g/dL — ABNORMAL LOW (ref 12.0–15.0)
Immature Granulocytes: 0 %
Lymphocytes Relative: 21 %
Lymphs Abs: 1.5 10*3/uL (ref 0.7–4.0)
MCH: 30.1 pg (ref 26.0–34.0)
MCHC: 31.6 g/dL (ref 30.0–36.0)
MCV: 95.5 fL (ref 80.0–100.0)
Monocytes Absolute: 0.8 10*3/uL (ref 0.1–1.0)
Monocytes Relative: 11 %
Neutro Abs: 4.6 10*3/uL (ref 1.7–7.7)
Neutrophils Relative %: 66 %
Platelet Count: 215 10*3/uL (ref 150–400)
RBC: 3.75 MIL/uL — ABNORMAL LOW (ref 3.87–5.11)
RDW: 13.2 % (ref 11.5–15.5)
WBC Count: 7 10*3/uL (ref 4.0–10.5)
nRBC: 0 % (ref 0.0–0.2)

## 2018-12-22 LAB — CMP (CANCER CENTER ONLY)
ALT: 15 U/L (ref 0–44)
AST: 20 U/L (ref 15–41)
Albumin: 3.9 g/dL (ref 3.5–5.0)
Alkaline Phosphatase: 74 U/L (ref 38–126)
Anion gap: 8 (ref 5–15)
BUN: 19 mg/dL (ref 8–23)
CO2: 30 mmol/L (ref 22–32)
Calcium: 9.2 mg/dL (ref 8.9–10.3)
Chloride: 101 mmol/L (ref 98–111)
Creatinine: 1.17 mg/dL — ABNORMAL HIGH (ref 0.44–1.00)
GFR, Est AFR Am: 51 mL/min — ABNORMAL LOW (ref >60)
GFR, Estimated: 44 mL/min — ABNORMAL LOW (ref >60)
Glucose, Bld: 86 mg/dL (ref 70–99)
Potassium: 3.7 mmol/L (ref 3.5–5.1)
Sodium: 139 mmol/L (ref 135–145)
Total Bilirubin: 0.3 mg/dL (ref 0.3–1.2)
Total Protein: 6.2 g/dL — ABNORMAL LOW (ref 6.5–8.1)

## 2018-12-22 MED ORDER — DENOSUMAB 60 MG/ML ~~LOC~~ SOSY
60.0000 mg | PREFILLED_SYRINGE | Freq: Once | SUBCUTANEOUS | Status: AC
  Administered 2018-12-22: 14:00:00 60 mg via SUBCUTANEOUS

## 2018-12-22 NOTE — Patient Instructions (Signed)
Denosumab injection What is this medicine? DENOSUMAB (den oh sue mab) slows bone breakdown. Prolia is used to treat osteoporosis in women after menopause and in men, and in people who are taking corticosteroids for 6 months or more. Xgeva is used to treat a high calcium level due to cancer and to prevent bone fractures and other bone problems caused by multiple myeloma or cancer bone metastases. Xgeva is also used to treat giant cell tumor of the bone. This medicine may be used for other purposes; ask your health care provider or pharmacist if you have questions. COMMON BRAND NAME(S): Prolia, XGEVA What should I tell my health care provider before I take this medicine? They need to know if you have any of these conditions:  dental disease  having surgery or tooth extraction  infection  kidney disease  low levels of calcium or Vitamin D in the blood  malnutrition  on hemodialysis  skin conditions or sensitivity  thyroid or parathyroid disease  an unusual reaction to denosumab, other medicines, foods, dyes, or preservatives  pregnant or trying to get pregnant  breast-feeding How should I use this medicine? This medicine is for injection under the skin. It is given by a health care professional in a hospital or clinic setting. A special MedGuide will be given to you before each treatment. Be sure to read this information carefully each time. For Prolia, talk to your pediatrician regarding the use of this medicine in children. Special care may be needed. For Xgeva, talk to your pediatrician regarding the use of this medicine in children. While this drug may be prescribed for children as young as 13 years for selected conditions, precautions do apply. Overdosage: If you think you have taken too much of this medicine contact a poison control center or emergency room at once. NOTE: This medicine is only for you. Do not share this medicine with others. What if I miss a dose? It is  important not to miss your dose. Call your doctor or health care professional if you are unable to keep an appointment. What may interact with this medicine? Do not take this medicine with any of the following medications:  other medicines containing denosumab This medicine may also interact with the following medications:  medicines that lower your chance of fighting infection  steroid medicines like prednisone or cortisone This list may not describe all possible interactions. Give your health care provider a list of all the medicines, herbs, non-prescription drugs, or dietary supplements you use. Also tell them if you smoke, drink alcohol, or use illegal drugs. Some items may interact with your medicine. What should I watch for while using this medicine? Visit your doctor or health care professional for regular checks on your progress. Your doctor or health care professional may order blood tests and other tests to see how you are doing. Call your doctor or health care professional for advice if you get a fever, chills or sore throat, or other symptoms of a cold or flu. Do not treat yourself. This drug may decrease your body's ability to fight infection. Try to avoid being around people who are sick. You should make sure you get enough calcium and vitamin D while you are taking this medicine, unless your doctor tells you not to. Discuss the foods you eat and the vitamins you take with your health care professional. See your dentist regularly. Brush and floss your teeth as directed. Before you have any dental work done, tell your dentist you are   receiving this medicine. Do not become pregnant while taking this medicine or for 5 months after stopping it. Talk with your doctor or health care professional about your birth control options while taking this medicine. Women should inform their doctor if they wish to become pregnant or think they might be pregnant. There is a potential for serious side  effects to an unborn child. Talk to your health care professional or pharmacist for more information. What side effects may I notice from receiving this medicine? Side effects that you should report to your doctor or health care professional as soon as possible:  allergic reactions like skin rash, itching or hives, swelling of the face, lips, or tongue  bone pain  breathing problems  dizziness  jaw pain, especially after dental work  redness, blistering, peeling of the skin  signs and symptoms of infection like fever or chills; cough; sore throat; pain or trouble passing urine  signs of low calcium like fast heartbeat, muscle cramps or muscle pain; pain, tingling, numbness in the hands or feet; seizures  unusual bleeding or bruising  unusually weak or tired Side effects that usually do not require medical attention (report to your doctor or health care professional if they continue or are bothersome):  constipation  diarrhea  headache  joint pain  loss of appetite  muscle pain  runny nose  tiredness  upset stomach This list may not describe all possible side effects. Call your doctor for medical advice about side effects. You may report side effects to FDA at 1-800-FDA-1088. Where should I keep my medicine? This medicine is only given in a clinic, doctor's office, or other health care setting and will not be stored at home. NOTE: This sheet is a summary. It may not cover all possible information. If you have questions about this medicine, talk to your doctor, pharmacist, or health care provider.  2020 Elsevier/Gold Standard (2017-05-03 16:10:44)

## 2019-01-14 ENCOUNTER — Other Ambulatory Visit: Payer: Self-pay

## 2019-01-14 ENCOUNTER — Ambulatory Visit
Admission: RE | Admit: 2019-01-14 | Discharge: 2019-01-14 | Disposition: A | Discharge status: Home / Self Care | Payer: Medicare Other | Source: Ambulatory Visit | Attending: Oncology | Admitting: Oncology

## 2019-01-14 DIAGNOSIS — Z1231 Encounter for screening mammogram for malignant neoplasm of breast: Secondary | ICD-10-CM

## 2019-01-25 NOTE — Progress Notes (Signed)
[place]  [phone] Fax:([phone]) [phone]    ID: NESHIA MCKENZIE   DOB: 1940/06/24  MR#: [id number]  ONG#:[id number]  Patient Care Team: Birder Robson., MD as PCP - General (Internal Medicine) Anushka Hartinger, Virgie Dad, MD (Hematology and Oncology) Selinda Orion, MD (Inactive) (Obstetrics and Gynecology) Gery Pray, MD as Consulting Physician (Radiation Oncology) Neldon Mc, MD as Consulting Physician (General Surgery) Holley Bouche, NP (Inactive) as Nurse Practitioner (Nurse Practitioner) Idamae Schuller, Laban Emperor, MD as Consulting Physician (Pulmonary Disease)  OTHER: Lind Guest PA (internal medicine)  CHIEF COMPLAINT:  Left Breast Cancer  CURRENT TREATMENT: Denosumab/Prolia   INTERVAL HISTORY: Abelina returns today for follow-up of her left breast cancer.  She continues under observation.  Thalia's last bone density screening on 01/06/2018, showed a T-score of -2.5, which is considered osteoporotic. She receives Prolia every 6 months for her osteoporosis, with the most recent dose 12/22/2018.  Since her last visit, she underwent bilateral screening mammography with tomography at [facility] on 01/14/2019 showing: breast density category D; no evidence of malignancy in either breast.   Of note, she was recently referred to Dr. Daneil Dolin at [facility] for persistent lung nodule from 2016 through 11/2018. Per Dr. Erline Hau note, the plan is to follow the nodule with a repeat chest CT in 12/2019.  Also she was found to have some tree in bud opacities felt to be due to to repeated aspiration.   REVIEW OF SYSTEMS: Peri has lost quite a bit of weight over the last 2 years.  This is documented below.  She says she walks about a half a mile most days, walking to and from oh the mailbox--they live [place] farm she says.  She denies cough phlegm production or pleurisy, rash, or fever.  She tells me she does saw her primary care doctor and her cardiologist and  "everything was fine".  She has had her first COVID-19 shot and tolerated it well.  A detailed review of systems today was otherwise noncontributory   HISTORY OF PRESENT ILLNESS:   From the original intake note:    Mrs. Berhe is a 79 year old female who early in 2003, noticed approximately a pea-sized nodule in the lower outer quadrant of her left breast.  She routinely performed self-breast examinations, and this was a new finding for her. She denies any pain in the breast area, nipple discharge or bleeding. The patient had undergone a routine screening mammogram at [facility] on April 22, 2001 which showed no suspicious areas in either breast.  The patient proceeded to be seen by Dr. March Rummage, who palpated a lesion in the 4 o'clock position of the breast which was very close to the skin.  Ultrasound of this area revealed it was not a simple cyst.  In light of the above findings, the patient proceeded to undergo excisional biopsy with pathology returning invasive ductal carcinoma, Grade 2 out of 3. 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  The tumor did focally involve the superficial margin. In light of this, the patient proceeded to undergo reexcision.  In addition, at the same time, she underwent a sentinel node procedure. The patient was found to have no residual carcinoma in the reexcision.  The sentinel node specimen revealed no evidence of metastatic disease.  Her subsequent history is as detailed below   PAST MEDICAL HISTORY: Past Medical History:  Diagnosis Date  . Breast cancer ([place])  2003   left  . Cancer ([place])   . Family history of breast cancer    PGM  . Hypertension   . Hypothyroidism   . Osteopenia   . Personal history of radiation therapy   . Seizures ([place])   . Tremor of both hands   . 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  The patient was previously on Synthroid in the past for thyroid problems but none currently.    PAST SURGICAL HISTORY: Past Surgical History:  Procedure Laterality Date  . ABDOMINAL HYSTERECTOMY  1970  . BREAST LUMPECTOMY  06/10/2001   Left - Dr Mertha Finders  . CATARACT EXTRACTION W/PHACO Left 07/28/2015   Procedure: CATARACT EXTRACTION PHACO AND INTRAOCULAR LENS PLACEMENT (IOC);  Surgeon: Eulogio Bear, MD;  Location: [facility] ORS;  Service: Ophthalmology;  Laterality: Left;  [place] 30.3 AP% 12.0 CDE 3.66 FLUID PACK LOT # [id number] H  . EYE SURGERY    . THYROIDECTOMY    The patient underwent a hysterectomy in 1973 for endometriosis. The patient has retained one ovary.   FAMILY HISTORY Family History  Problem Relation Age of Onset  . Heart disease Mother   . Heart disease Father   . Breast cancer Maternal Grandmother   Significant for paternal grandmother with history of breast cancer, diagnosed at age 13.   There is no other family history of breast or gynecologic malignancies or other type malignancies.     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 Her husband of 16+ years, [name], worked as a Architectural technologist for a [employer]. They have a son whom they adopted at age 83 months,, Ron, 43+, who runs the patient's farm.  He lives on the same property and pretty much looks after Justyn and her husband daily.  They grow tobacco, soybean's, and wheat. She has 2 grandsons, the older 1 already at [facility] state.  The patient is a Tourist information centre manager.    ADVANCED DIRECTIVES: in place   HEALTH MAINTENANCE:  Social History   Tobacco Use  . Smoking status: Never Smoker  . Smokeless tobacco: Never Used  Substance Use Topics  . Alcohol use: No  . Drug use: No     Colonoscopy: 2012/[facility]  PAP: Status post hysterectomy  Bone density: July 2013,  osteopenia  Lipid panel: Dr. Ronnald Ramp  Allergies  Allergen Reactions  . Morphine And Related Itching, Nausea And Vomiting and Other (See Comments)    Caused GI upset. Also caused patient to black out. Unaware of what was going on around her. Need help to move around.  . Penicillins Anaphylaxis and Hives    Hives go around the neck Has patient had a PCN reaction causing immediate rash, facial/tongue/throat swelling, SOB or lightheadedness with hypotension: Yes Has patient had a PCN reaction causing severe rash involving mucus membranes or skin necrosis: Yes Has patient had a PCN reaction that required hospitalization No Has patient had a PCN reaction occurring within the last 10 years: No If all of the above answers are "NO", then may proceed with Cephalosporin use.   . Codeine Nausea And Vomiting    Current Outpatient Medications  Medication Sig Dispense Refill  . amLODipine (NORVASC) 10 MG tablet Take 10 mg by mouth daily.    Marland Kitchen aspirin 81 MG tablet Take 81 mg by  mouth daily.      . calcium citrate (CALCITRATE - DOSED IN MG ELEMENTAL CALCIUM) 950 MG tablet Take 1 tablet by mouth daily.      . carbamazepine (TEGRETOL) 200 MG tablet Take 400 mg by mouth at bedtime.     . carvedilol (COREG) 12.5 MG tablet Take 12.5 mg by mouth 2 (two) times daily with a meal.     . cholecalciferol (VITAMIN D) 1000 UNITS tablet Take 2,000 Units by mouth daily.     Marland Kitchen escitalopram (LEXAPRO) 10 MG tablet Take 10 mg by mouth daily.    Marland Kitchen levothyroxine (SYNTHROID, LEVOTHROID) 112 MCG tablet Take 112 mcg by mouth daily before breakfast.    . triamterene-hydrochlorothiazide (DYAZIDE) 37.5-25 MG capsule Take 1 capsule by mouth daily.     No current facility-administered medications for this visit.    OBJECTIVE: Elderly white woman in no acute distress  Vitals:   01/26/19 1043  BP: (!) 153/72  Pulse: (!) 57  Resp: 17  Temp: 98.3 F (36.8 C)  SpO2: 100%     Body mass index is 21.55 kg/m.    ECOG FS:  1 Filed Weights   01/26/19 1043  Weight: 129 lb 8 oz (58.7 kg)    Sclerae unicteric, EOMs intact Wearing a mask No cervical or supraclavicular adenopathy Lungs no rales or rhonchi Heart regular rate and rhythm Abd soft, nontender, positive bowel sounds MSK kyphosis but no focal spinal tenderness, no upper extremity lymphedema Neuro: nonfocal, well oriented, appropriate affect Breasts: The right breast is benign.  The left breast has undergone lumpectomy and radiation with no evidence of local recurrence.  Both axillae are benign.  LAB RESULTS: Lab Results  Component Value Date   WBC 7.0 12/22/2018   NEUTROABS 4.6 12/22/2018   HGB 11.3 (L) 12/22/2018   HCT 35.8 (L) 12/22/2018   MCV 95.5 12/22/2018   PLT 215 12/22/2018      Chemistry      Component Value Date/Time   NA 139 12/22/2018 1229   NA 140 05/02/2016 0911   K 3.7 12/22/2018 1229   K 3.8 05/02/2016 0911   CL 101 12/22/2018 1229   CL 98 10/15/2011 1342   CO2 30 12/22/2018 1229   CO2 24 05/02/2016 0911   BUN 19 12/22/2018 1229   BUN 24.5 05/02/2016 0911   CREATININE 1.17 (H) 12/22/2018 1229   CREATININE 1.1 05/02/2016 0911      Component Value Date/Time   CALCIUM 9.2 12/22/2018 1229   CALCIUM 9.9 05/02/2016 0911   ALKPHOS 74 12/22/2018 1229   ALKPHOS 89 12/05/2015 0938   AST 20 12/22/2018 1229   AST 13 12/05/2015 0938   ALT 15 12/22/2018 1229   ALT 9 12/05/2015 0938   BILITOT 0.3 12/22/2018 1229   BILITOT 0.22 12/05/2015 0938       STUDIES: MM 3D SCREEN BREAST BILATERAL  Result Date: 01/14/2019 CLINICAL DATA:  Screening. EXAM: DIGITAL SCREENING BILATERAL MAMMOGRAM WITH TOMO AND CAD COMPARISON:  Previous exam(s). ACR Breast Density Category d: The breast tissue is extremely dense, which lowers the sensitivity of mammography FINDINGS: There are no findings suspicious for malignancy. Images were processed with CAD. IMPRESSION: No mammographic evidence of malignancy. A result letter of this screening  mammogram will be mailed directly to the patient. RECOMMENDATION: Screening mammogram in one year. (Code:SM-B-01Y) BI-RADS CATEGORY  1: Negative. Electronically Signed   By: Lovey Newcomer M.D.   On: 01/14/2019 13:22     ASSESSMENT: 79 y.o. [place] woman   (  1)  status post left lumpectomy and sentinel lymph node biopsy June 2003 for a T2 N0 invasive ductal carcinoma, ER positive, PR and HER2 negative,   (2)  status post Cytoxan and Adriamycin times four  (3)  Received radiation therapy, completed October 2003   (4)  Arimidex started in October 2003, discontinued January 2019  (5) osteopenia, currently receiving zoledronic acid annually, with first dose on 10/26/2011  (a) bone density 12/14/2015 showed a T score of -1.7 (down from -1.22 years prior)  (b) repeat bone density 01/06/2018 shows a T score of -2.5 (osteoporosis)  (c) switched to denosumab/Prolia beginning April 2020  (6) lung nodule noted 2016, followed [facility], next chest CT scan scheduled December 2021   PLAN:  Sumer is now 17-1/2 years out from definitive surgery for her breast cancer with no evidence of disease recurrence.  This is very favorable.  We are treating her with denosumab/Prolia for her osteoporosis.  She will need a repeat bone density December 2021.  Her next dose is due June and then December 2021.  Yesennia has lost quite a bit of weight over the last 2 years.  She weighed 257 pounds in January 2019, 145 pounds in January 2020, and 129 pounds this month.  That is almost a pound a month in the last 36 months.  She feels she is eating a good diet, although it certainly not a high calorie diet: She mostly eats vegetables.  She is not exercising regularly likely she has lost some muscle weight as she becomes more elderly and more sedentary.  Her weight right now is just about perfect for her and I encouraged her to try to maintain it.  She is going to increase carbohydrates a little and see if that is helpful.   The  other area of concern is her breast density which was read as a D and the most recent mammography.  It has been read as a C before.  This raises the question of whether we should do a breast MRI at least every other year.     I am trying to minimize interventions given the patient's age and the fact that generally she looks quite well and had unremarkable labs a month ago we are not proceeding to breast MRI or CT scan of the chest and bone scan at this point.  Instead I am adding a visit when she returns to see [place] in June and if she was not able to maintain her weight I think at that point we would like to intervene.  She has a good understanding of this plan.  Total encounter time 35 minutes.* Ginia Rudell, Virgie Dad, MD  01/26/19 2:59 PM Medical Oncology and Hematology [facility] [place], [place] [address] Tel. [phone]    Fax. [phone]   I, Wilburn Mylar, am acting as scribe for Dr. Virgie Dad. Berlinda Farve.  I, Lurline Del MD, have reviewed the above documentation for accuracy and completeness, and I agree with the above.    *Total Encounter Time as defined by the Centers for Medicare and Medicaid Services includes, in addition to the face-to-face time of a patient visit (documented in the note above) non-face-to-face time: obtaining and reviewing outside history, ordering and reviewing medications, tests or procedures, care coordination (communications with other health care professionals or caregivers) and documentation in the medical record.

## 2019-01-26 ENCOUNTER — Inpatient Hospital Stay: Payer: Medicare Other | Attending: Oncology | Admitting: Oncology

## 2019-01-26 ENCOUNTER — Other Ambulatory Visit: Payer: Self-pay

## 2019-01-26 VITALS — BP 153/72 | HR 57 | Temp 98.3°F | Resp 17 | Ht 65.0 in | Wt 129.5 lb

## 2019-01-26 DIAGNOSIS — R634 Abnormal weight loss: Secondary | ICD-10-CM | POA: Diagnosis not present

## 2019-01-26 DIAGNOSIS — Z7982 Long term (current) use of aspirin: Secondary | ICD-10-CM | POA: Insufficient documentation

## 2019-01-26 DIAGNOSIS — M818 Other osteoporosis without current pathological fracture: Secondary | ICD-10-CM

## 2019-01-26 DIAGNOSIS — Z170 Estrogen receptor positive status [ER+]: Secondary | ICD-10-CM

## 2019-01-26 DIAGNOSIS — M810 Age-related osteoporosis without current pathological fracture: Secondary | ICD-10-CM | POA: Insufficient documentation

## 2019-01-26 DIAGNOSIS — C50512 Malignant neoplasm of lower-outer quadrant of left female breast: Secondary | ICD-10-CM

## 2019-01-26 DIAGNOSIS — Z79899 Other long term (current) drug therapy: Secondary | ICD-10-CM | POA: Diagnosis not present

## 2019-01-26 DIAGNOSIS — M8580 Other specified disorders of bone density and structure, unspecified site: Secondary | ICD-10-CM | POA: Diagnosis not present

## 2019-01-26 DIAGNOSIS — Z803 Family history of malignant neoplasm of breast: Secondary | ICD-10-CM | POA: Diagnosis not present

## 2019-01-26 DIAGNOSIS — Z923 Personal history of irradiation: Secondary | ICD-10-CM | POA: Insufficient documentation

## 2019-01-26 DIAGNOSIS — Z853 Personal history of malignant neoplasm of breast: Secondary | ICD-10-CM | POA: Diagnosis not present

## 2019-01-27 ENCOUNTER — Telehealth: Payer: Self-pay | Admitting: Oncology

## 2019-01-27 NOTE — Telephone Encounter (Signed)
I sent to HIM to mail per patient request

## 2019-02-02 ENCOUNTER — Ambulatory Visit: Payer: Medicare Other | Admitting: Rehabilitation

## 2019-02-03 ENCOUNTER — Other Ambulatory Visit: Payer: Self-pay

## 2019-02-03 ENCOUNTER — Encounter: Payer: Self-pay | Admitting: Physical Therapy

## 2019-02-03 ENCOUNTER — Ambulatory Visit: Payer: Medicare Other | Attending: Oncology | Admitting: Physical Therapy

## 2019-02-03 DIAGNOSIS — R262 Difficulty in walking, not elsewhere classified: Secondary | ICD-10-CM | POA: Diagnosis present

## 2019-02-03 DIAGNOSIS — M6281 Muscle weakness (generalized): Secondary | ICD-10-CM | POA: Insufficient documentation

## 2019-02-03 DIAGNOSIS — R293 Abnormal posture: Secondary | ICD-10-CM | POA: Insufficient documentation

## 2019-02-03 NOTE — Therapy (Signed)
[facility] [place], [place], [address] Phone: [phone]   Fax:  [phone]  Physical Therapy Evaluation  Patient Details  Name: Catherine Carlson MRN: [id number] Date of Birth: 05-07-1940 Referring Provider (PT): magrinat   Encounter Date: 02/03/2019  PT End of Session - 02/03/19 1548    Visit Number  1    Number of Visits  9    Date for PT Re-Evaluation  03/03/19    PT Start Time  1509   pt arrived late   PT Stop Time  1545    PT Time Calculation (min)  36 min    Activity Tolerance  Patient tolerated treatment well    Behavior During Therapy  [facility] for tasks assessed/performed       Past Medical History:  Diagnosis Date  . Breast cancer ([place]) 2003   left  . Cancer ([place])   . Family history of breast cancer    PGM  . Hypertension   . Hypothyroidism   . Osteopenia   . Personal history of radiation therapy   . Seizures ([place])   . Tremor of both hands   . Vitamin D deficiency 10/15/2011    Past Surgical History:  Procedure Laterality Date  . ABDOMINAL HYSTERECTOMY  1970  . BREAST LUMPECTOMY  06/10/2001   Left - Dr Mertha Finders  . CATARACT EXTRACTION W/PHACO Left 07/28/2015   Procedure: CATARACT EXTRACTION PHACO AND INTRAOCULAR LENS PLACEMENT (IOC);  Surgeon: Eulogio Bear, MD;  Location: [facility] ORS;  Service: Ophthalmology;  Laterality: Left;  [place] 30.3 AP% 12.0 CDE 3.66 FLUID PACK LOT # [id number] H  . EYE SURGERY    . THYROIDECTOMY      There were no vitals filed for this visit.   Subjective Assessment - 02/03/19 1515    Subjective  I have trouble standing up straight. I have scoliosis and it makes me want to give in to it. I don't want to get fixed this way.    Pertinent History  L breast cancer with lumpectomy 2003, osteopenia, osteoporosis, kyphosis, scoliosis, CHF, hypertension    Patient Stated Goals  to be able to stand up more straight    Currently in Pain?  No/denies         [facility] PT  Assessment - 02/03/19 0001      Assessment   Medical Diagnosis  scoliosis, L breast cancer    Referring Provider (PT)  magrinat    Onset Date/Surgical Date  06/10/01    Hand Dominance  Right    Prior Therapy  none      Precautions   Precautions  None      Restrictions   Weight Bearing Restrictions  No      Balance Screen   Has the patient fallen in the past 6 months  Yes    How many times?  1, tripped/slipped on hardwood floor    Has the patient had a decrease in activity level because of a fear of falling?   No    Is the patient reluctant to leave their home because of a fear of falling?   No      Home Social worker  Private residence    Living Arrangements  Spouse/significant other    Available Help at Discharge  Family    Type of [place] to enter    Entrance Stairs-Number of Steps  5   from garage  Home Layout  Multi-level   2 and 3 steps seperate levels     Prior Function   Level of Independence  Independent    Vocation  Retired    Leisure  pt does exercises in bathtubs, in summer walks about half a mile down driveway, walks around in house for exercise      Cognition   Overall Cognitive Status  Within Functional Limits for tasks assessed      Posture/Postural Control   Posture/Postural Control  Postural limitations    Postural Limitations  Rounded Shoulders;Forward head;Increased thoracic kyphosis    Posture Comments  right scapular winging      ROM / Strength   AROM / PROM / Strength  Strength      Strength   Overall Strength Comments  UE grossly 4/5    Right Hip Flexion  3+/5    Right Hip ABduction  4+/5   in sitting   Right Hip ADduction  4+/5   in sitting   Left Hip Flexion  4-/5    Left Hip ABduction  4+/5   in sitting   Left Hip ADduction  4+/5   in sitting   Right Knee Flexion  3+/5    Right Knee Extension  3+/5    Left Knee Flexion  4/5    Left Knee Extension  3+/5    Right Ankle Dorsiflexion   4+/5    Left Ankle Dorsiflexion  4+/5      Transfers   Transfers  Sit to Stand    Sit to Stand  Other (comment);Without upper extremity assist   8 reps in 30 sec which is below avg for her age            Katina Dung - 02/03/19 0001    Open a tight or new jar  Mild difficulty    Do heavy household chores (wash walls, wash floors)  No difficulty    Carry a shopping bag or briefcase  Mild difficulty    Wash your back  No difficulty    Use a knife to cut food  Moderate difficulty    Recreational activities in which you take some force or impact through your arm, shoulder, or hand (golf, hammering, tennis)  Moderate difficulty    During the past week, to what extent has your arm, shoulder or hand problem interfered with your normal social activities with family, friends, neighbors, or groups?  Modererately    During the past week, to what extent has your arm, shoulder or hand problem limited your work or other regular daily activities  Slightly    Arm, shoulder, or hand pain.  Mild    Tingling (pins and needles) in your arm, shoulder, or hand  None    Difficulty Sleeping  Mild difficulty    DASH Score  25 %        Objective measurements completed on examination: See above findings.              PT Education - 02/03/19 1554    Education Details  sit to stand technique: not adducting knees, eccentric control when sitting, scooting forward before standing and not using hands    Person(s) Educated  Patient    Methods  Demonstration;Explanation    Comprehension  Verbalized understanding;Returned demonstration;Verbal cues required          PT Long Term Goals - 02/03/19 1555      PT LONG TERM GOAL #1   Title  Pt will  demonstrate 4+/5 right hip flexion strength to improve functional mobliity.    Baseline  3+/5    Time  4    Period  Weeks    Status  New    Target Date  03/03/19      PT LONG TERM GOAL #2   Title  Pt will be able to sit to stand from a chair 11x in  30 sec without using hands to improve overall mobility and decrease fall risk.    Baseline  8 reps    Time  4    Period  Weeks    Status  New    Target Date  03/03/19      PT LONG TERM GOAL #3   Title  Pt will report at least a 40% improvement in overall back fatigue by the end of the day to allow pt to return to prior level of function.    Time  4    Period  Weeks    Status  New    Target Date  03/03/19      PT LONG TERM GOAL #4   Title  Pt will be independent in a home exercise program for continued strengthening and stretching.    Time  4    Period  Weeks    Status  New    Target Date  03/03/19      PT LONG TERM GOAL #5   Title  Pt will demonstrate 4+/5 R hamstring strength to improve functional mobility.    Baseline  3+/5    Time  4    Period  Weeks    Status  New    Target Date  03/03/19             Plan - 02/03/19 1548    Clinical Impression Statement  Pt presents to PT with back pain and fatigue due to scoliosis. She reports by the end of the day her back is very tight and she has difficulty maintaining upright posture for long periods of time. She has increased thoracic kyphosis with right scapular winging. Her lower extremity strength is grossly 3+/5 throughout and her RLE is weaker than her LLE. She has difficulty standing from a chair without use of UEs. She also has had a fall within the past 6 months and reports weight loss recently. Pt would benefit from skilled PT servies to improve LE strength, improve posture to help decrease back pain and fatigue.    Personal Factors and Comorbidities  Age;Fitness;Time since onset of injury/illness/exacerbation;Comorbidity 1;Other    Comorbidities  CHF, drives an hour to her appt    Examination-Activity Limitations  Lift;Locomotion Level;Transfers    Examination-Participation Restrictions  Community Activity;Yard Work;Cleaning    Stability/Clinical Decision Making  Stable/Uncomplicated    Clinical Decision Making  Low     Rehab Potential  Good    PT Frequency  2x / week    PT Duration  4 weeks    PT Treatment/Interventions  ADLs/Self Care Home Management;Therapeutic exercise;Therapeutic activities;Patient/family education;Balance training;Neuromuscular re-education;Manual techniques    PT Next Visit Plan  begin LE strengthening and balance exercises, give supine scap to help with posture, pec stretches, postural exercises    PT Home Exercise Plan  work on sit to stands at home avoiding adducting knees, scooting forward and not using UEs    Consulted and Agree with Plan of Care  Patient       Patient will benefit from skilled therapeutic intervention in order to improve  the following deficits and impairments:  Difficulty walking, Decreased activity tolerance, Decreased strength, Postural dysfunction, Pain  Visit Diagnosis: Muscle weakness (generalized)  Abnormal posture  Difficulty in walking, not elsewhere classified     Problem List Patient Active Problem List   Diagnosis Date Noted  . Osteoporosis 01/22/2018  . CHF (congestive heart failure) ([place]) 01/22/2018  . Osteopenia 10/23/2012  . Postmenopausal estrogen deficiency 10/23/2012  . Vitamin D deficiency 10/15/2011  . Malignant neoplasm of lower-outer quadrant of left breast of female, estrogen receptor positive ([place]) 09/18/2010    [name] [facility] 02/03/2019, 3:59 PM  [place], [place], [address] Phone: [phone]   Fax:  [phone]  Name: Catherine Carlson MRN: [id number] Date of Birth: [birth year]  Manus Gunning, PT 02/03/19 4:00 PM

## 2019-02-10 ENCOUNTER — Other Ambulatory Visit: Payer: Self-pay

## 2019-02-10 ENCOUNTER — Ambulatory Visit: Payer: Medicare Other | Attending: Oncology | Admitting: Physical Therapy

## 2019-02-10 ENCOUNTER — Encounter: Payer: Self-pay | Admitting: Physical Therapy

## 2019-02-10 DIAGNOSIS — M6281 Muscle weakness (generalized): Secondary | ICD-10-CM

## 2019-02-10 DIAGNOSIS — R293 Abnormal posture: Secondary | ICD-10-CM | POA: Diagnosis present

## 2019-02-10 DIAGNOSIS — R262 Difficulty in walking, not elsewhere classified: Secondary | ICD-10-CM | POA: Insufficient documentation

## 2019-02-10 NOTE — Patient Instructions (Signed)
PELVIC TILT: Posterior    Keep knees and feet hip width apart. Tighten abdominals, flatten low back- tilting pelvic backwards.  _10__ reps per set, hold for 3 seconds.  _1__ sets per day, _3-7__ days per week   Copyright  VHI. All rights reserved.

## 2019-02-10 NOTE — Therapy (Signed)
[place], [place], [address] Phone: [phone]   Fax:  [phone]  Physical Therapy Treatment  Patient Details  Name: Catherine Carlson MRN: [id number] Date of Birth: 21-Jul-1940 Referring Provider (PT): magrinat   Encounter Date: 02/10/2019  PT End of Session - 02/10/19 1155    Visit Number  2    Number of Visits  9    Date for PT Re-Evaluation  03/03/19    PT Start Time  1103    PT Stop Time  1153    PT Time Calculation (min)  50 min    Activity Tolerance  Patient tolerated treatment well    Behavior During Therapy  [facility] for tasks assessed/performed       Past Medical History:  Diagnosis Date  . Breast cancer ([place]) 2003   left  . Cancer ([place])   . Family history of breast cancer    PGM  . Hypertension   . Hypothyroidism   . Osteopenia   . Personal history of radiation therapy   . Seizures ([place])   . Tremor of both hands   . Vitamin D deficiency 10/15/2011    Past Surgical History:  Procedure Laterality Date  . ABDOMINAL HYSTERECTOMY  1970  . BREAST LUMPECTOMY  06/10/2001   Left - Dr Mertha Finders  . CATARACT EXTRACTION W/PHACO Left 07/28/2015   Procedure: CATARACT EXTRACTION PHACO AND INTRAOCULAR LENS PLACEMENT (IOC);  Surgeon: Eulogio Bear, MD;  Location: [facility] ORS;  Service: Ophthalmology;  Laterality: Left;  [place] 30.3 AP% 12.0 CDE 3.66 FLUID PACK LOT # [id number] H  . EYE SURGERY    . THYROIDECTOMY      There were no vitals filed for this visit.  Subjective Assessment - 02/10/19 1105    Subjective  My back hurts all the time.    Pertinent History  L breast cancer with lumpectomy 2003, osteopenia, osteoporosis, kyphosis, scoliosis, CHF, hypertension    Patient Stated Goals  to be able to stand up more straight    Currently in Pain?  Yes    Pain Score  8     Pain Location  Back    Pain Orientation  Mid    Pain Descriptors / Indicators  Constant;Nagging    Pain Type  Chronic pain     Pain Onset  More than a month ago    Pain Frequency  Constant    Aggravating Factors   riding in car    Pain Relieving Factors  using heat    Effect of Pain on Daily Activities  hard to get comfortable                       [facility] Adult PT Treatment/Exercise - 02/10/19 0001      Exercises   Exercises  Other Exercises    Other Exercises   Instructed pt in Strength ABC exercises starting at beginning through crunches which was substituted for pelvic tilts- stopped here due to time, did not do superwoman due to pt unable to get in quadriped position, had her use chair for quadricep stretch and hold on to counter- instructed pt to hold all stretches for 30 seconds- pt required several recovery periods secondary to fatigue                  PT Long Term Goals - 02/03/19 1555      PT LONG TERM GOAL #1   Title  Pt will demonstrate  4+/5 right hip flexion strength to improve functional mobliity.    Baseline  3+/5    Time  4    Period  Weeks    Status  New    Target Date  03/03/19      PT LONG TERM GOAL #2   Title  Pt will be able to sit to stand from a chair 11x in 30 sec without using hands to improve overall mobility and decrease fall risk.    Baseline  8 reps    Time  4    Period  Weeks    Status  New    Target Date  03/03/19      PT LONG TERM GOAL #3   Title  Pt will report at least a 40% improvement in overall back fatigue by the end of the day to allow pt to return to prior level of function.    Time  4    Period  Weeks    Status  New    Target Date  03/03/19      PT LONG TERM GOAL #4   Title  Pt will be independent in a home exercise program for continued strengthening and stretching.    Time  4    Period  Weeks    Status  New    Target Date  03/03/19      PT LONG TERM GOAL #5   Title  Pt will demonstrate 4+/5 R hamstring strength to improve functional mobility.    Baseline  3+/5    Time  4    Period  Weeks    Status  New    Target Date   03/03/19            Plan - 02/10/19 1156    Clinical Impression Statement  Began instructing pt in Strength ABC program starting with stretches through pelvic tilts which was substituted for crunches. Pt required therapist demo as well as verbal and tactile cues to perform pelvic tilts correctly. Educated pt to break stretches up over the course of the day to help decresae fatigue. Pt required several recovery periods due to fatigue today.    PT Frequency  2x / week    PT Duration  4 weeks    PT Treatment/Interventions  ADLs/Self Care Home Management;Therapeutic exercise;Therapeutic activities;Patient/family education;Balance training;Neuromuscular re-education;Manual techniques    PT Next Visit Plan  instruct pt in rest of Strength ABC beginning at bridging, begin LE strengthening and balance exercises, give supine scap to help with posture, pec stretches, postural exercises    PT Home Exercise Plan  work on sit to stands at home avoiding adducting knees, scooting forward and not using UEs    Consulted and Agree with Plan of Care  Patient       Patient will benefit from skilled therapeutic intervention in order to improve the following deficits and impairments:  Difficulty walking, Decreased activity tolerance, Decreased strength, Postural dysfunction, Pain  Visit Diagnosis: Muscle weakness (generalized)  Abnormal posture  Difficulty in walking, not elsewhere classified     Problem List Patient Active Problem List   Diagnosis Date Noted  . Osteoporosis 01/22/2018  . CHF (congestive heart failure) ([place]) 01/22/2018  . Osteopenia 10/23/2012  . Postmenopausal estrogen deficiency 10/23/2012  . Vitamin D deficiency 10/15/2011  . Malignant neoplasm of lower-outer quadrant of left breast of female, estrogen receptor positive ([place]) 09/18/2010    Allyson Carlson [facility] 02/10/2019, 11:58 AM  [place] [phone]  [place], [place], [address] Phone: [phone]   Fax:  [phone]  Name: Catherine Carlson MRN: [id number] Date of Birth: [birth year]  Manus Gunning, PT 02/10/19 11:58 AM

## 2019-02-12 ENCOUNTER — Encounter: Payer: Self-pay | Admitting: Physical Therapy

## 2019-02-12 ENCOUNTER — Encounter: Payer: Medicare Other | Admitting: Physical Therapy

## 2019-02-12 ENCOUNTER — Ambulatory Visit: Payer: Medicare Other | Admitting: Physical Therapy

## 2019-02-12 ENCOUNTER — Other Ambulatory Visit: Payer: Self-pay

## 2019-02-12 DIAGNOSIS — R262 Difficulty in walking, not elsewhere classified: Secondary | ICD-10-CM

## 2019-02-12 DIAGNOSIS — M6281 Muscle weakness (generalized): Secondary | ICD-10-CM | POA: Diagnosis not present

## 2019-02-12 DIAGNOSIS — R293 Abnormal posture: Secondary | ICD-10-CM

## 2019-02-12 NOTE — Therapy (Signed)
[place], [place], [address] Phone: [phone]   Fax:  [phone]  Physical Therapy Treatment  Patient Details  Name: Catherine Carlson MRN: [id number] Date of Birth: [birth year] Referring Provider (PT): magrinat   Encounter Date: 02/12/2019  PT End of Session - 02/12/19 1156    Visit Number  3    Number of Visits  9    Date for PT Re-Evaluation  03/03/19    PT Start Time  1106    PT Stop Time  1156    PT Time Calculation (min)  50 min    Activity Tolerance  Patient tolerated treatment well    Behavior During Therapy  [facility] for tasks assessed/performed       Past Medical History:  Diagnosis Date  . Breast cancer ([place]) 2003   left  . Cancer ([place])   . Family history of breast cancer    PGM  . Hypertension   . Hypothyroidism   . Osteopenia   . Personal history of radiation therapy   . Seizures ([place])   . Tremor of both hands   . Vitamin D deficiency 10/15/2011    Past Surgical History:  Procedure Laterality Date  . ABDOMINAL HYSTERECTOMY  1970  . BREAST LUMPECTOMY  06/10/2001   Left - Dr Mertha Finders  . CATARACT EXTRACTION W/PHACO Left 07/28/2015   Procedure: CATARACT EXTRACTION PHACO AND INTRAOCULAR LENS PLACEMENT (IOC);  Surgeon: Eulogio Bear, MD;  Location: [facility] ORS;  Service: Ophthalmology;  Laterality: Left;  [place] 30.3 AP% 12.0 CDE 3.66 FLUID PACK LOT # [id number] H  . EYE SURGERY    . THYROIDECTOMY      There were no vitals filed for this visit.  Subjective Assessment - 02/12/19 1108    Subjective  I did the stretches after last time and I checked off the ones I did.    Pertinent History  L breast cancer with lumpectomy 2003, osteopenia, osteoporosis, kyphosis, scoliosis, CHF, hypertension    Patient Stated Goals  to be able to stand up more straight    Currently in Pain?  No/denies    Pain Score  0-No pain                       [facility] Adult PT Treatment/Exercise -  02/12/19 0001      Exercises   Other Exercises   Continued instructing pt in Strength ABC program starting at pelvic tilts which pt still required max verbal and tactile cues for. Instructed pt through the rest of the program using 1 lb weights/10 reps. Pt required max verbal and tactile cues to perform exercises correct and especially for correct form. Pt reports she has trouble remembering things.                   PT Long Term Goals - 02/03/19 1555      PT LONG TERM GOAL #1   Title  Pt will demonstrate 4+/5 right hip flexion strength to improve functional mobliity.    Baseline  3+/5    Time  4    Period  Weeks    Status  New    Target Date  03/03/19      PT LONG TERM GOAL #2   Title  Pt will be able to sit to stand from a chair 11x in 30 sec without using hands to improve overall mobility and decrease fall risk.    Baseline  8  reps    Time  4    Period  Weeks    Status  New    Target Date  03/03/19      PT LONG TERM GOAL #3   Title  Pt will report at least a 40% improvement in overall back fatigue by the end of the day to allow pt to return to prior level of function.    Time  4    Period  Weeks    Status  New    Target Date  03/03/19      PT LONG TERM GOAL #4   Title  Pt will be independent in a home exercise program for continued strengthening and stretching.    Time  4    Period  Weeks    Status  New    Target Date  03/03/19      PT LONG TERM GOAL #5   Title  Pt will demonstrate 4+/5 R hamstring strength to improve functional mobility.    Baseline  3+/5    Time  4    Period  Weeks    Status  New    Target Date  03/03/19            Plan - 02/12/19 1156    Clinical Impression Statement  Continued instructing pt in Strength ABC beginning at pelvic tilts to the end of the program. Pt currently requires max verbal and tactile cues to perform exercises correctly. She has difficulty demonstrating correct form without tactile cues. Made notes on  exercises to help her remember correct form. Also educated and left notes on packet for how to progress resistance with exercises by increasing number of sets prior to increasing weight. Once pt is independent with these exercises will begin balance exercises.    PT Frequency  2x / week    PT Duration  4 weeks    PT Treatment/Interventions  ADLs/Self Care Home Management;Therapeutic exercise;Therapeutic activities;Patient/family education;Balance training;Neuromuscular re-education;Manual techniques    PT Next Visit Plan  have pt look at Strength ABC program and demonstrate exercises without any cueing to begin with and then correct as needed to assess indep with program, begin LE strengthening and balance exercises, give supine scap to help with posture, pec stretches, postural exercises    PT Home Exercise Plan  work on sit to stands at home avoiding adducting knees, scooting forward and not using UEs, Strength ABC    Consulted and Agree with Plan of Care  Patient       Patient will benefit from skilled therapeutic intervention in order to improve the following deficits and impairments:  Difficulty walking, Decreased activity tolerance, Decreased strength, Postural dysfunction, Pain  Visit Diagnosis: Muscle weakness (generalized)  Abnormal posture  Difficulty in walking, not elsewhere classified     Problem List Patient Active Problem List   Diagnosis Date Noted  . Osteoporosis 01/22/2018  . CHF (congestive heart failure) ([place]) 01/22/2018  . Osteopenia 10/23/2012  . Postmenopausal estrogen deficiency 10/23/2012  . Vitamin D deficiency 10/15/2011  . Malignant neoplasm of lower-outer quadrant of left breast of female, estrogen receptor positive ([place]) 09/18/2010    [name] [facility] 02/12/2019, 12:01 PM  [place], [place], [address] Phone: [phone]   Fax:  [phone]  Name: Catherine Carlson MRN: [id number] Date of Birth: [birth year]  Manus Gunning, PT 02/12/19 12:01 PM

## 2019-02-18 ENCOUNTER — Ambulatory Visit: Payer: Medicare Other

## 2019-02-18 ENCOUNTER — Other Ambulatory Visit: Payer: Self-pay

## 2019-02-18 DIAGNOSIS — M6281 Muscle weakness (generalized): Secondary | ICD-10-CM

## 2019-02-18 DIAGNOSIS — R293 Abnormal posture: Secondary | ICD-10-CM

## 2019-02-18 DIAGNOSIS — R262 Difficulty in walking, not elsewhere classified: Secondary | ICD-10-CM

## 2019-02-18 NOTE — Therapy (Signed)
[place], [place], [address] Phone: [phone]   Fax:  [phone]  Physical Therapy Treatment  Patient Details  Name: Catherine Carlson MRN: [id number] Date of Birth: 05-23-1940 Referring Provider (PT): magrinat   Encounter Date: 02/18/2019  PT End of Session - 02/18/19 1109    Visit Number  4    Number of Visits  9    Date for PT Re-Evaluation  03/03/19    PT Start Time  1107    PT Stop Time  1200    PT Time Calculation (min)  53 min    Activity Tolerance  Patient tolerated treatment well    Behavior During Therapy  [facility] for tasks assessed/performed       Past Medical History:  Diagnosis Date  . Breast cancer ([place]) 2003   left  . Cancer ([place])   . Family history of breast cancer    PGM  . Hypertension   . Hypothyroidism   . Osteopenia   . Personal history of radiation therapy   . Seizures ([place])   . Tremor of both hands   . Vitamin D deficiency 10/15/2011    Past Surgical History:  Procedure Laterality Date  . ABDOMINAL HYSTERECTOMY  1970  . BREAST LUMPECTOMY  06/10/2001   Left - Dr Mertha Finders  . CATARACT EXTRACTION W/PHACO Left 07/28/2015   Procedure: CATARACT EXTRACTION PHACO AND INTRAOCULAR LENS PLACEMENT (IOC);  Surgeon: Eulogio Bear, MD;  Location: [facility] ORS;  Service: Ophthalmology;  Laterality: Left;  [place] 30.3 AP% 12.0 CDE 3.66 FLUID PACK LOT # [id number] H  . EYE SURGERY    . THYROIDECTOMY      There were no vitals filed for this visit.  Subjective Assessment - 02/18/19 1109    Subjective  Pt reports that she is tired. She had her second covid shot on Monday and it made her very fatigued. She has not done her exercises for two days.    Pertinent History  L breast cancer with lumpectomy 2003, osteopenia, osteoporosis, kyphosis, scoliosis, CHF, hypertension    Patient Stated Goals  to be able to stand up more straight    Currently in Pain?  No/denies    Pain Score  0-No pain                        [facility] Adult PT Treatment/Exercise - 02/18/19 0001      Exercises   Exercises  Other Exercises;Lumbar    Other Exercises   Instruction in the [facility] program with tactile cueing, verbal cueing and demonstration throughout for correct movement with activities. Pt had difficulty following the pictures. Pt got short of breathe getting into different positions for stretches, rest breaks between stretches to help improve shortness of breathe. She did well with activities in supine. Unilateral pec stretch w/VC for direction of hand and to look at picture, Posterior shoulder stretch with tactile cueing to get a stretch on the posterior shoulder, tricep stretch with extra time spent explaining modification for on the wall rather than over head due to pt postural deficit, calf stretch w/VC and demonstration for correct movement after multiple VC to look at picture, Quad stretch w/VC for pushing hips fwd to get quad stretch and holding onto something stable, demonstration of figure 4 stretch for modified butter fly stretch, Pt was able to perform hamstring stretch w/o VC, Pt requires VC to keep feet together for lower trunk rotation stretch, pelvic tilt  with VC to contract lower belly and tactile cueing with hand behind back to get correct movement, glute bridge with VC to life hips folowing pt looking at picture, chest press w/1# wgts demonstration for correct movement after 2x VC to look at movement in picture, mini squat modification for sit to stand with demonstration for correct movement after pt had difficulty following instructions, standing bent over row with good performance, tricep kick back with tactile cueing at the elbow and demonstration to keep pt brachium in place to address triceps, standing heel raises with demonstration and VC to squeeze glutes using counter for balance due to inability to perform w/o UE support, bicep curl with demonstration and VC to prevent brachium  from moving modified into sitting due to shortness of breath with standing activities.       Lumbar Exercises: Supine   Other Supine Lumbar Exercises  Decompression position 5 minutes with palms up/hook-lying, scapular retraction 10x w/tactile cueing, demonstration and VC. Chin tuck in supine with VC and demonstration for the correct movement w/pt reporting stretching throughout.             PT Education - 02/18/19 1113    Education Details  Pt was educated step by step through the strength ABC program.    Person(s) Educated  Patient    Methods  Explanation;Demonstration;Tactile cues;Verbal cues;Handout    Comprehension  Verbalized understanding;Returned demonstration          PT Long Term Goals - 02/03/19 1555      PT LONG TERM GOAL #1   Title  Pt will demonstrate 4+/5 right hip flexion strength to improve functional mobliity.    Baseline  3+/5    Time  4    Period  Weeks    Status  New    Target Date  03/03/19      PT LONG TERM GOAL #2   Title  Pt will be able to sit to stand from a chair 11x in 30 sec without using hands to improve overall mobility and decrease fall risk.    Baseline  8 reps    Time  4    Period  Weeks    Status  New    Target Date  03/03/19      PT LONG TERM GOAL #3   Title  Pt will report at least a 40% improvement in overall back fatigue by the end of the day to allow pt to return to prior level of function.    Time  4    Period  Weeks    Status  New    Target Date  03/03/19      PT LONG TERM GOAL #4   Title  Pt will be independent in a home exercise program for continued strengthening and stretching.    Time  4    Period  Weeks    Status  New    Target Date  03/03/19      PT LONG TERM GOAL #5   Title  Pt will demonstrate 4+/5 R hamstring strength to improve functional mobility.    Baseline  3+/5    Time  4    Period  Weeks    Status  New    Target Date  03/03/19            Plan - 02/18/19 1108    Clinical Impression  Statement  Pt continues to require significant assistance with ABC exercises and has poor kinesthetic awareness resulting in  significant tactile and VC in order to address the correct muscle with varying stretches and exercises. Pt gets short of breathe quickly with any activities in standing including streching which improves significantly with modifications into sitting/supine. Meeks decompression exercises initiated due to pt posture to elongate and open the thoracic spine which most likely exacerbates symptoms of CHF with shortness of breathe. Pt will benefit from continued POC at this time.    Personal Factors and Comorbidities  Age;Fitness;Time since onset of injury/illness/exacerbation;Comorbidity 1;Other    Comorbidities  CHF, drives an hour to her appt    Examination-Activity Limitations  Lift;Locomotion Level;Transfers    Examination-Participation Restrictions  Community Activity;Yard Work;Cleaning    Rehab Potential  Good    PT Frequency  2x / week    PT Duration  4 weeks    PT Treatment/Interventions  ADLs/Self Care Home Management;Therapeutic exercise;Therapeutic activities;Patient/family education;Balance training;Neuromuscular re-education;Manual techniques    PT Next Visit Plan  possibly perform Meeks? Postural exercises w/pec stretches, go through strength ABC with rest breaks andpossibly more modifications for sitting due to shortness of breathe. balance exercises as needed.    PT Home Exercise Plan  work on sit to stands at home avoiding adducting knees, scooting forward and not using UEs, Strength ABC    Consulted and Agree with Plan of Care  Patient       Patient will benefit from skilled therapeutic intervention in order to improve the following deficits and impairments:  Difficulty walking, Decreased activity tolerance, Decreased strength, Postural dysfunction, Pain  Visit Diagnosis: Muscle weakness (generalized)  Abnormal posture  Difficulty in walking, not elsewhere  classified     Problem List Patient Active Problem List   Diagnosis Date Noted  . Osteoporosis 01/22/2018  . CHF (congestive heart failure) ([place]) 01/22/2018  . Osteopenia 10/23/2012  . Postmenopausal estrogen deficiency 10/23/2012  . Vitamin D deficiency 10/15/2011  . Malignant neoplasm of lower-outer quadrant of left breast of female, estrogen receptor positive ([place]) 09/18/2010    Ander Purpura, PT 02/18/2019, 12:01 PM  [place], [place], [address] Phone: [phone]   Fax:  [phone]  Name: Catherine Carlson MRN: [id number] Date of Birth: 1940-03-24

## 2019-02-19 ENCOUNTER — Encounter: Payer: Medicare Other | Admitting: Physical Therapy

## 2019-02-20 ENCOUNTER — Encounter: Payer: Medicare Other | Admitting: Physical Therapy

## 2019-02-24 ENCOUNTER — Encounter: Payer: Medicare Other | Admitting: Physical Therapy

## 2019-02-24 ENCOUNTER — Ambulatory Visit: Payer: Medicare Other | Admitting: Physical Therapy

## 2019-02-24 ENCOUNTER — Encounter: Payer: Self-pay | Admitting: Physical Therapy

## 2019-02-24 ENCOUNTER — Other Ambulatory Visit: Payer: Self-pay

## 2019-02-24 DIAGNOSIS — M6281 Muscle weakness (generalized): Secondary | ICD-10-CM

## 2019-02-24 DIAGNOSIS — R262 Difficulty in walking, not elsewhere classified: Secondary | ICD-10-CM

## 2019-02-24 DIAGNOSIS — R293 Abnormal posture: Secondary | ICD-10-CM

## 2019-02-24 NOTE — Therapy (Addendum)
[place], [place], [address] Phone: [phone]   Fax:  [phone]  Physical Therapy Treatment  Patient Details  Name: Catherine Carlson MRN: [id number] Date of Birth: 10/06/1940 Referring Provider (PT): magrinat   Encounter Date: 02/24/2019  PT End of Session - 02/24/19 1205    Visit Number  5    Number of Visits  9    Date for PT Re-Evaluation  03/03/19    PT Start Time  1110    PT Stop Time  1158    PT Time Calculation (min)  48 min    Activity Tolerance  Patient tolerated treatment well    Behavior During Therapy  [facility] for tasks assessed/performed       Past Medical History:  Diagnosis Date  . Breast cancer ([place]) 2003   left  . Cancer ([place])   . Family history of breast cancer    PGM  . Hypertension   . Hypothyroidism   . Osteopenia   . Personal history of radiation therapy   . Seizures ([place])   . Tremor of both hands   . Vitamin D deficiency 10/15/2011    Past Surgical History:  Procedure Laterality Date  . ABDOMINAL HYSTERECTOMY  1970  . BREAST LUMPECTOMY  06/10/2001   Left - Dr Mertha Finders  . CATARACT EXTRACTION W/PHACO Left 07/28/2015   Procedure: CATARACT EXTRACTION PHACO AND INTRAOCULAR LENS PLACEMENT (IOC);  Surgeon: Eulogio Bear, MD;  Location: [facility] ORS;  Service: Ophthalmology;  Laterality: Left;  [place] 30.3 AP% 12.0 CDE 3.66 FLUID PACK LOT # [id number] H  . EYE SURGERY    . THYROIDECTOMY      There were no vitals filed for this visit.  Subjective Assessment - 02/24/19 1112    Subjective  I am going to cancel my appointment on Thursday due to the ice. The exercises are doing ok but this morning I got up and my back is giving me a fit. I think that was due to vacuuming.    Pertinent History  L breast cancer with lumpectomy 2003, osteopenia, osteoporosis, kyphosis, scoliosis, CHF, hypertension    Patient Stated Goals  to be able to stand up more straight    Currently in Pain?   Yes    Pain Score  7     Pain Location  Back    Pain Orientation  Mid    Pain Descriptors / Indicators  Aching    Pain Type  Chronic pain    Pain Onset  More than a month ago    Pain Frequency  Intermittent                       [facility] Adult PT Treatment/Exercise - 02/24/19 0001      Exercises   Exercises  Other Exercises;Lumbar    Other Exercises   Instruction in the [facility] program with tactile cueing, verbal cueing and demonstration throughout for correct movement with activities. Pt had difficulty following the pictures. Pt did not demonstrate shortness of breath today.  Unilateral pec stretch w/VC for direction of hand and to look at picture, Posterior shoulder stretch with tactile cueing to get a stretch on the posterior shoulder, tricep stretch with extra time spent explaining modification for on the wall rather than over head due to pt postural deficit, calf stretch w/VC and demonstration for correct movement after multiple VC to look at picture, pt unable to demonstrate correct pelvic tilt techinque despite  verbal and tactle cues,  Pt was able to perform hamstring stretch w/ VC to reach down towards ankles, Pt requires VC to keep feet together for lower trunk rotation stretch and v/c to keep feet apart for bridging, pelvic tilt with VC to contract lower belly and tactile cueing with hand behind back to get correct movement, glute bridge with VC to life hips folowing pt looking at picture, chest press w/1# wgts demonstration for correct movement after 2x VC to look at movement in picture, mini squat modification for sit to stand with demonstration for correct movement after pt had difficulty following instructions, standing bent over row with good performance, tricep kick back with tactile cueing at the elbow and demonstration to keep pt brachium in place to address triceps, standing heel raises with demonstration and VC to squeeze glutes using counter for balance due to inability to  perform w/o UE support, bicep curl did not require any verbal cueing      Lumbar Exercises: Supine   Other Supine Lumbar Exercises  Decompression position 5 minutes with palms up/hook-lying, scapular retraction 5x w/tactile cueing, demonstration and VC. Chin tuck in supine with VC and demonstration for the correct movement w/pt reporting stretching throughout.                  PT Long Term Goals - 02/03/19 1555      PT LONG TERM GOAL #1   Title  Pt will demonstrate 4+/5 right hip flexion strength to improve functional mobliity.    Baseline  3+/5    Time  4    Period  Weeks    Status  New    Target Date  03/03/19      PT LONG TERM GOAL #2   Title  Pt will be able to sit to stand from a chair 11x in 30 sec without using hands to improve overall mobility and decrease fall risk.    Baseline  8 reps    Time  4    Period  Weeks    Status  New    Target Date  03/03/19      PT LONG TERM GOAL #3   Title  Pt will report at least a 40% improvement in overall back fatigue by the end of the day to allow pt to return to prior level of function.    Time  4    Period  Weeks    Status  New    Target Date  03/03/19      PT LONG TERM GOAL #4   Title  Pt will be independent in a home exercise program for continued strengthening and stretching.    Time  4    Period  Weeks    Status  New    Target Date  03/03/19      PT LONG TERM GOAL #5   Title  Pt will demonstrate 4+/5 R hamstring strength to improve functional mobility.    Baseline  3+/5    Time  4    Period  Weeks    Status  New    Target Date  03/03/19            Plan - 02/24/19 1206    Clinical Impression Statement  Continued to spend time re instructing pt on Strength ABC exercises because pt is still requring mod/max verbal and tactile cues to perform exercises correctly. She has very poor kinesthetic awareness and difficulty folloiwng pictures and directions to perform exercises  correctly. Continued with Meeks  decompression exercises since pt had back pain due to recent vacuuming. Pt would benefit from continued strengthening and balance exercises.    PT Frequency  2x / week    PT Duration  4 weeks    PT Treatment/Interventions  ADLs/Self Care Home Management;Therapeutic exercise;Therapeutic activities;Patient/family education;Balance training;Neuromuscular re-education;Manual techniques    PT Next Visit Plan  Assess goals and extend POC, possible shift away from HEP and work on standing LE strengthening and balance exercises to improve mobility, possibly perform Meeks? Postural exercises w/pec stretches, go through strength ABC with rest breaks andpossibly more modifications for sitting due to shortness of breathe. balance exercises as needed.    PT Home Exercise Plan  work on sit to stands at home avoiding adducting knees, scooting forward and not using UEs, Strength ABC    Consulted and Agree with Plan of Care  Patient       Patient will benefit from skilled therapeutic intervention in order to improve the following deficits and impairments:  Difficulty walking, Decreased activity tolerance, Decreased strength, Postural dysfunction, Pain  Visit Diagnosis: Muscle weakness (generalized)  Abnormal posture  Difficulty in walking, not elsewhere classified     Problem List Patient Active Problem List   Diagnosis Date Noted  . Osteoporosis 01/22/2018  . CHF (congestive heart failure) (HCC) 01/22/2018  . Osteopenia 10/23/2012  . Postmenopausal estrogen deficiency 10/23/2012  . Vitamin D deficiency 10/15/2011  . Malignant neoplasm of lower-outer quadrant of left breast of female, estrogen receptor positive (HCC) 09/18/2010    Blaire Breedlove Blue 02/24/2019, 12:08 PM  [facility] [address] [place], [place], [address] Phone: [phone]   Fax:  [phone]  Name: Catherine Carlson MRN: [id number] Date of Birth: 08/20/1940  Blaire  Breedlove Blue, PT 02/24/19 12:08 PM   PHYSICAL THERAPY DISCHARGE SUMMARY  Visits from Start of Care: 5  Current functional level related to goals / functional outcomes: See above   Remaining deficits: See above   Education / Equipment: HEP  Plan: Patient agrees to discharge.  Patient goals were not met. Patient is being discharged due to not returning since the last visit.  ?????    Blaire Breedlove Blue, PT 06/18/19 4:37 PM   

## 2019-02-26 ENCOUNTER — Encounter: Payer: Medicare Other | Admitting: Physical Therapy

## 2019-03-03 ENCOUNTER — Encounter: Payer: Medicare Other | Admitting: Physical Therapy

## 2019-03-05 ENCOUNTER — Encounter: Payer: Medicare Other | Admitting: Physical Therapy

## 2019-06-01 IMAGING — MG DIGITAL SCREENING BILATERAL MAMMOGRAM WITH TOMO AND CAD
6 of 10 series · 6 of 30 positions shown · non-contrast
Comparison: Previous exam(s).

CLINICAL DATA: Screening.

EXAM:
DIGITAL SCREENING BILATERAL MAMMOGRAM WITH TOMO AND CAD

[R MLO synth-2D]
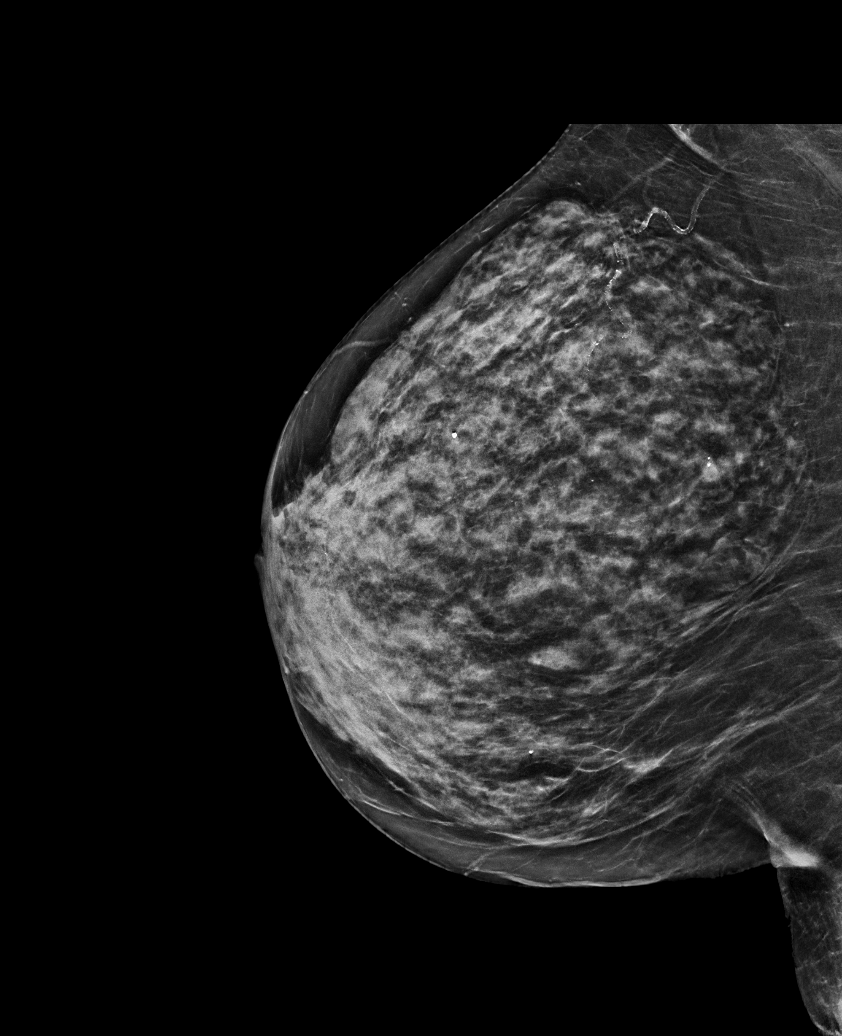

[L MLO synth-2D (1 of 2)]
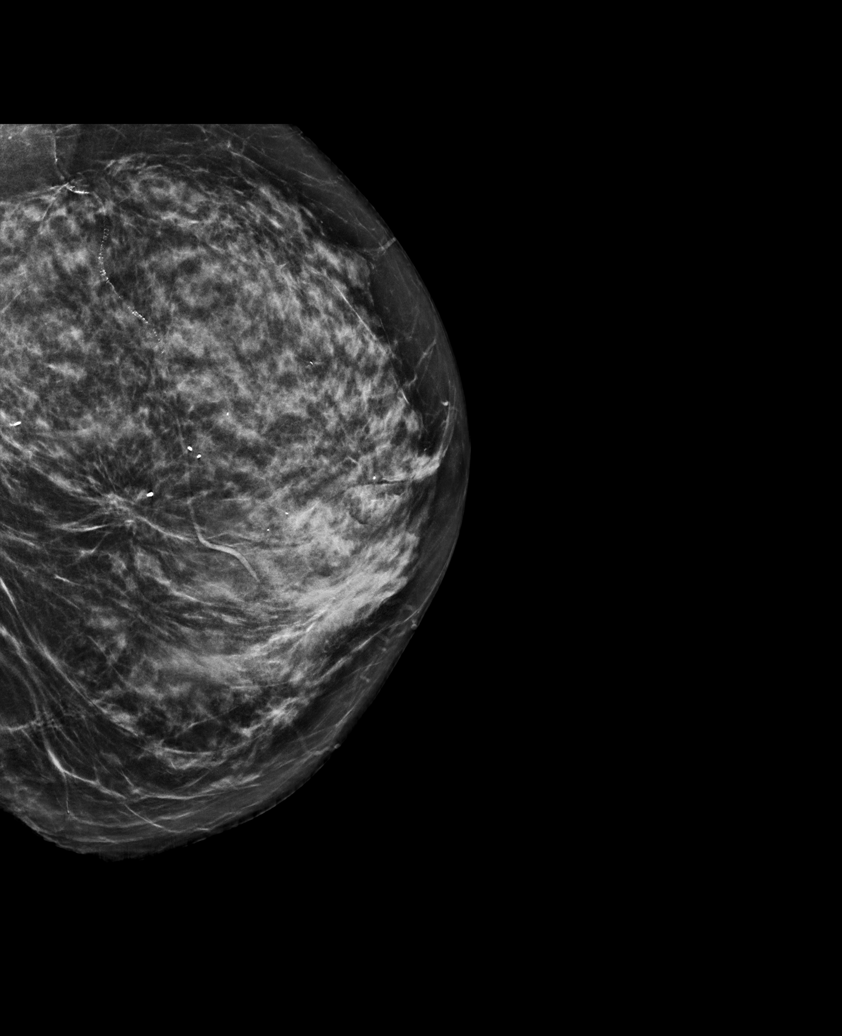

[L CC synth-2D]
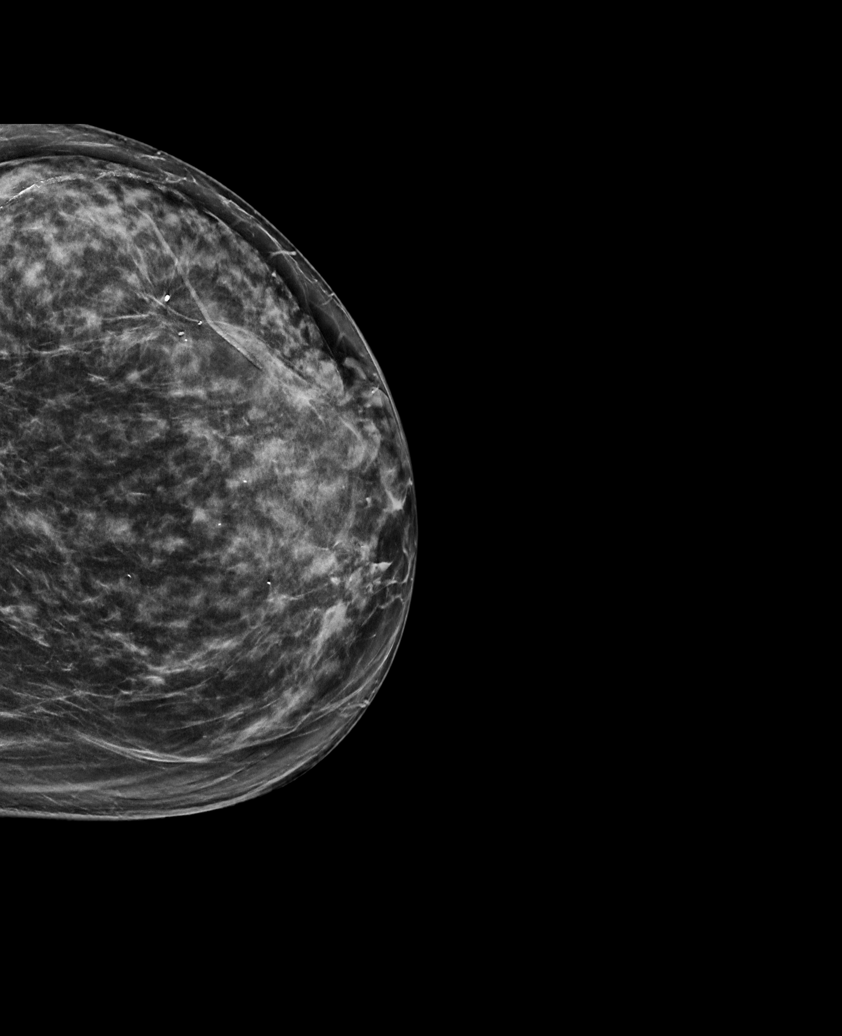

[L MLO synth-2D (2 of 2)]
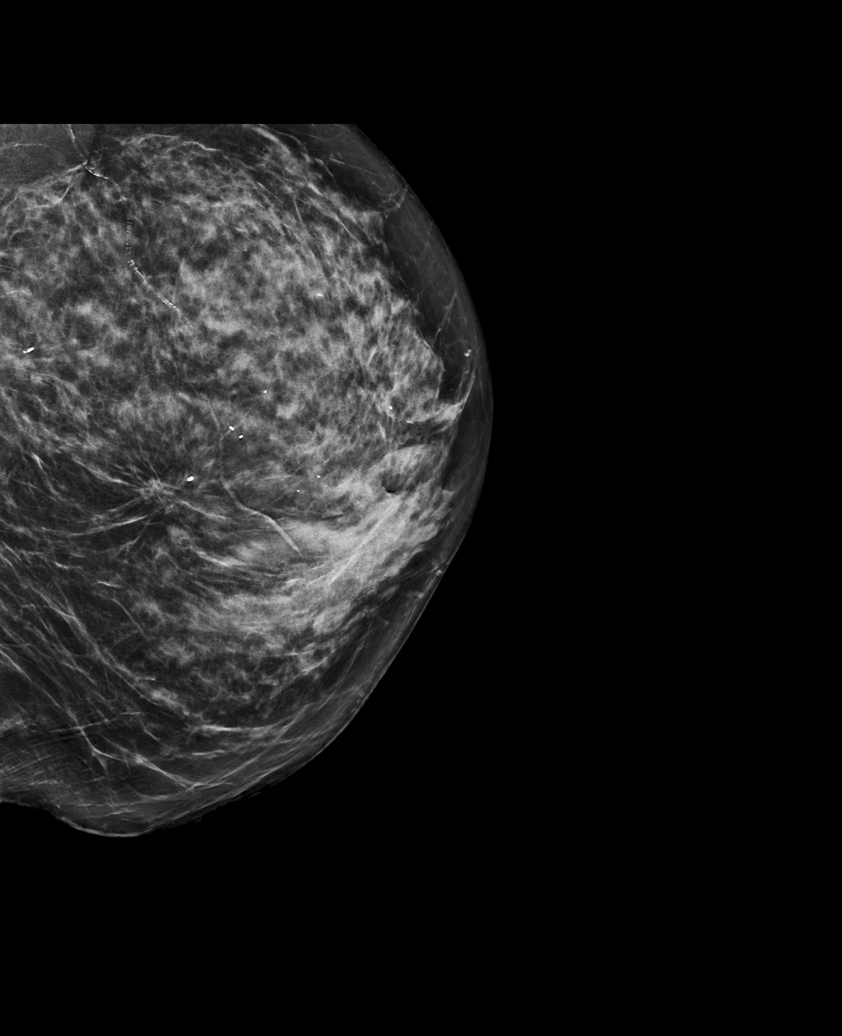

[R CC synth-2D]
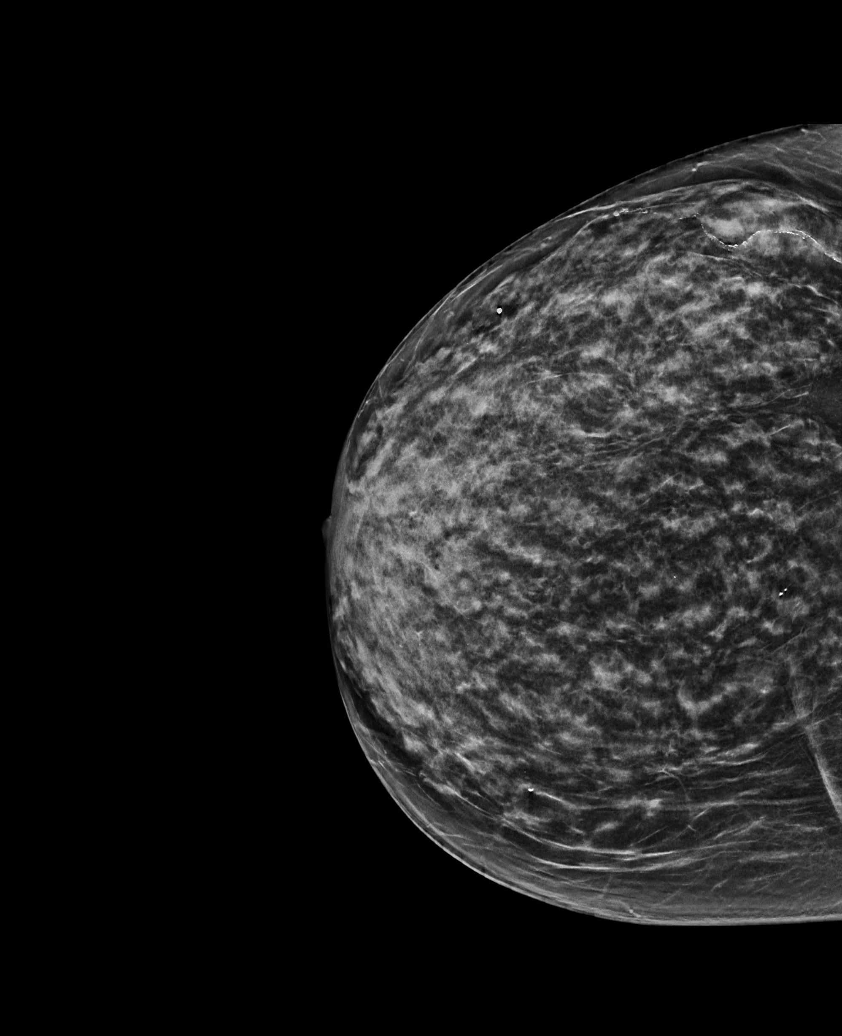

[L MLO tomo · tomo slice 37/72.0]
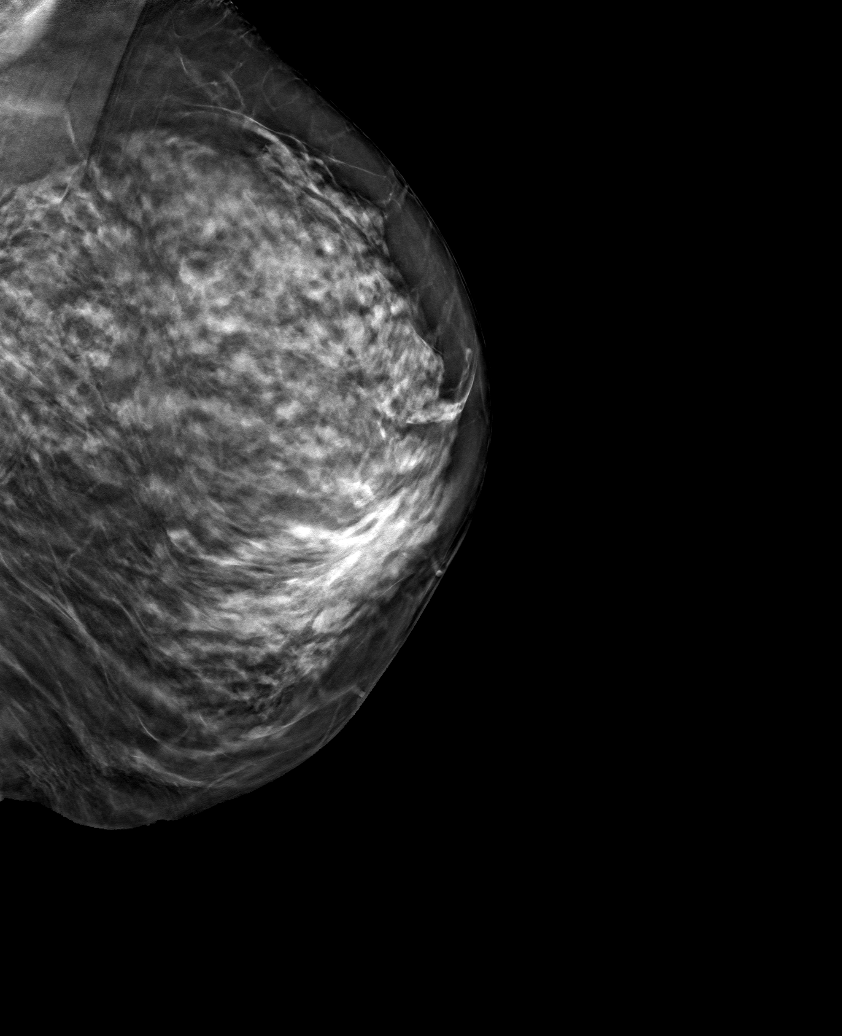

[6 of 30 positions shown; findings below may reference images not displayed]

ACR Breast Density Category c: The breast tissue is heterogeneously
dense, which may obscure small masses.
FINDINGS: There are no findings suspicious for malignancy. Images were
processed with CAD.
IMPRESSION: No mammographic evidence of malignancy. A result letter of this
screening mammogram will be mailed directly to the patient.

RECOMMENDATION:
Screening mammogram in one year. (Code:[id number]-U-LHB)

BI-RADS CATEGORY  1: Negative.

## 2019-07-26 NOTE — Progress Notes (Signed)
Catherine Carlson  [phone] [phone]    ID: Catherine Carlson   DOB: 1940/02/16  MR#: [id number]  QIH#:[id number]  Patient Care Team: Catherine Carlson., MD as PCP - General (Internal Medicine) Magrinat, Catherine Dad, MD (Hematology and Oncology) Catherine Pray, MD as Consulting Physician (Radiation Oncology) Catherine Mc, MD as Consulting Physician (General Surgery) Carlson, Catherine Emperor, MD as Consulting Physician (Pulmonary Disease) Catherine Darter, MD as Referring Physician (Cardiology) Catherine Dow, MD as Referring Physician (Pulmonary Disease)  OTHER: Catherine Guest PA (internal medicine)  CHIEF COMPLAINT:  Left Breast Cancer  CURRENT TREATMENT: Denosumab/Prolia   INTERVAL HISTORY: Catherine Carlson returns today for follow-up of her left breast cancer.  She continues under observation.  Catherine Carlson's last bone density screening on 01/06/2018, showed a T-score of -2.5, which is considered osteoporotic. She receives Prolia every 6 months for her osteoporosis, with the most recent dose 12/22/2018.  Since her last visit, she underwent bilateral screening mammography with tomography at [facility] on 01/14/2019 showing: breast density category D; no evidence of malignancy in either breast.   She was diagnosed with Parkinsons about two weeks ago.  She has a brain MRI scheduled on 08/19/2019.  She cannot recall the neurologist who she saw, but plans to call us with this information.     REVIEW OF SYSTEMS: Catherine Carlson notes that she fell earlier this summer, and had some fractures in her ribs related to this.  She is going to undergo her parkinsons work up and will see PT back after that has completed.  She denies any new issues, and is still eating three meals a day and boost supplementation.     Catherine Carlson is without any fever, chills, chest pain, palpitations, cough, bowel/bladder changes, headaches, nausea, vomiting, or any other concerns. A detailed ROS was  otherwise non contributory.     HISTORY OF PRESENT ILLNESS:   From the original intake note:    Catherine Carlson is a 79 year old female who early in 2003, noticed approximately a pea-sized nodule in the lower outer quadrant of her left breast.  She routinely performed self-breast examinations, and this was a new finding for her. She denies any pain in the breast area, nipple discharge or bleeding. The patient had undergone a routine screening mammogram [facility] on April 22, 2001 which showed no suspicious areas in either breast.  The patient proceeded to be seen by Catherine. March Carlson, who palpated a lesion in the 4 o'clock position of the breast which was very close to the skin.  Ultrasound of this area revealed it was not a simple cyst.  In light of the above findings, the patient proceeded to undergo excisional biopsy with pathology returning invasive ductal carcinoma, Grade 2 out of 3. 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  The tumor did focally involve the superficial margin. In light of this, the patient proceeded to undergo reexcision.  In addition, at the same time, she underwent a sentinel node procedure. The patient was found to have no residual carcinoma in the reexcision.  The sentinel node specimen revealed no evidence of metastatic disease.  Her subsequent history is as detailed below   PAST MEDICAL HISTORY: Past Medical History:  Diagnosis Date  . Breast cancer ([place]) 2003   left  . Cancer ([place])   . Family history of breast cancer    PGM  . Hypertension   . Hypothyroidism   .  Osteopenia   . Personal history of radiation therapy   . Seizures ([place])   . Tremor of both hands   . 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  The patient was previously on Synthroid in the past for thyroid  problems but none currently.    PAST SURGICAL HISTORY: Past Surgical History:  Procedure Laterality Date  . ABDOMINAL HYSTERECTOMY  1970  . BREAST LUMPECTOMY  06/10/2001   Left - Catherine Carlson  . CATARACT EXTRACTION W/PHACO Left 07/28/2015   Procedure: CATARACT EXTRACTION PHACO AND INTRAOCULAR LENS PLACEMENT (IOC);  Surgeon: Catherine Bear, MD;  Location: [facility] ORS;  Service: Ophthalmology;  Laterality: Left;  [place] 30.3 AP% 12.0 CDE 3.66 FLUID PACK LOT # [id number] H  . EYE SURGERY    . THYROIDECTOMY    The patient underwent a hysterectomy in 1973 for endometriosis. The patient has retained one ovary.   FAMILY HISTORY Family History  Problem Relation Age of Onset  . Heart disease Mother   . Heart disease Father   . Breast cancer Maternal Grandmother   Significant for paternal grandmother with history of breast cancer, diagnosed at age 77.   There is no other family history of breast or gynecologic malignancies or other type malignancies.     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 Her husband of 30+ years, Catherine Carlson, worked as a Architectural technologist for a [employer]. They have a son whom they adopted at age 76 months,, Catherine Carlson, 43+, who runs the patient's farm.  He lives on the same property and pretty much looks after Catherine Carlson and her husband daily.  They grow tobacco, soybean's, and wheat. She has 2 grandsons, the older 1 already at [facility] state.  The patient is a Tourist information centre manager.    ADVANCED DIRECTIVES: in place   HEALTH MAINTENANCE:  Social History   Tobacco Use  . Smoking status: Never Smoker  . Smokeless tobacco: Never Used  Substance Use Topics  . Alcohol use: No  . Drug use: No     Colonoscopy: 2012/Catherine Carlson  PAP: Status post hysterectomy  Bone density: July 2013, osteopenia  Lipid panel: Catherine. Ronnald Carlson  Allergies  Allergen Reactions  . Morphine And Related Itching, Nausea And  Vomiting and Other (See Comments)    Caused GI upset. Also caused patient to black out. Unaware of what was going on around her. Need help to move around.  . Penicillins Anaphylaxis and Hives    Hives go around the neck Has patient had a PCN reaction causing immediate rash, facial/tongue/throat swelling, SOB or lightheadedness with hypotension: Yes Has patient had a PCN reaction causing severe rash involving mucus membranes or skin necrosis: Yes Has patient had a PCN reaction that required hospitalization No Has patient had a PCN reaction occurring within the last 10 years: No If all of the above answers are "NO", then may proceed with Cephalosporin use.   . Codeine Nausea And Vomiting    Current Outpatient Medications  Medication Sig Dispense Refill  . amLODipine (NORVASC) 10 MG tablet Take 10 mg by mouth daily.    Marland Kitchen aspirin 81 MG tablet Take 81 mg by mouth daily.      . calcium citrate (CALCITRATE - DOSED IN MG ELEMENTAL CALCIUM) 950 MG tablet Take 1 tablet by mouth daily.      Marland Kitchen  carbamazepine (TEGRETOL) 200 MG tablet Take 400 mg by mouth at bedtime.     . carbidopa-levodopa (SINEMET IR) 25-100 MG tablet Take 1 tablet by mouth 3 (three) times daily.    . carvedilol (COREG) 12.5 MG tablet Take 12.5 mg by mouth 2 (two) times daily with a meal.     . cholecalciferol (VITAMIN D) 1000 UNITS tablet Take 2,000 Units by mouth daily.     Marland Kitchen escitalopram (LEXAPRO) 10 MG tablet Take 10 mg by mouth daily.    Marland Kitchen levothyroxine (SYNTHROID, LEVOTHROID) 112 MCG tablet Take 112 mcg by mouth daily before breakfast.    . lisinopril (ZESTRIL) 5 MG tablet Take 5 mg by mouth daily.    Marland Kitchen triamterene-hydrochlorothiazide (DYAZIDE) 37.5-25 MG capsule Take 1 capsule by mouth daily.     No current facility-administered medications for this visit.    OBJECTIVE: Elderly white woman in no acute distress  Vitals:   07/27/19 1048  BP: (!) 142/75  Pulse: (!) 58  Resp: 18  Temp: 98.3 F (36.8 C)  SpO2: 98%      Body mass index is 20.39 kg/m.    ECOG FS: 1 Filed Weights   07/27/19 1048  Weight: 122 lb 8 oz (55.6 kg)   GENERAL: Patient is a well appearing female in no acute distress HEENT:  Sclerae anicteric.  Mask in place.   Neck is supple.  NODES:  No cervical, supraclavicular, or axillary lymphadenopathy palpated.  BREAST EXAM:  Right breast benign, left breast without any sign of local recurrence LUNGS:  Clear to auscultation bilaterally.  No wheezes or rhonchi. HEART:  Regular rate and rhythm. No murmur appreciated. ABDOMEN:  Soft, nontender.  Positive, normoactive bowel sounds. No organomegaly palpated. MSK:  No focal spinal tenderness to palpation. Full range of motion bilaterally in the upper extremities. EXTREMITIES:  No peripheral edema.   SKIN:  Clear with no obvious rashes or skin changes. No nail dyscrasia. NEURO:  Nonfocal. Well oriented.  Appropriate affect.    LAB RESULTS: Lab Results  Component Value Date   WBC 7.6 07/27/2019   NEUTROABS 4.6 07/27/2019   HGB 12.2 07/27/2019   HCT 38.5 07/27/2019   MCV 94.4 07/27/2019   PLT 188 07/27/2019      Chemistry      Component Value Date/Time   NA 137 07/27/2019 1025   NA 140 05/02/2016 0911   K 4.0 07/27/2019 1025   K 3.8 05/02/2016 0911   CL 100 07/27/2019 1025   CL 98 10/15/2011 1342   CO2 30 07/27/2019 1025   CO2 24 05/02/2016 0911   BUN 21 07/27/2019 1025   BUN 24.5 05/02/2016 0911   CREATININE 1.20 (H) 07/27/2019 1025   CREATININE 1.17 (H) 12/22/2018 1229   CREATININE 1.1 05/02/2016 0911      Component Value Date/Time   CALCIUM 10.0 07/27/2019 1025   CALCIUM 9.9 05/02/2016 0911   ALKPHOS 89 07/27/2019 1025   ALKPHOS 89 12/05/2015 0938   AST 16 07/27/2019 1025   AST 20 12/22/2018 1229   AST 13 12/05/2015 0938   ALT 7 07/27/2019 1025   ALT 15 12/22/2018 1229   ALT 9 12/05/2015 0938   BILITOT 0.3 07/27/2019 1025   BILITOT 0.3 12/22/2018 1229   BILITOT 0.22 12/05/2015 0938       STUDIES: No results  found.   ASSESSMENT: 79 y.o. [place] woman   (1)  status post left lumpectomy and sentinel lymph node biopsy June 2003 for a T2 N0 invasive  ductal carcinoma, ER positive, PR and HER2 negative,   (2)  status post Cytoxan and Adriamycin times four  (3)  Received radiation therapy, completed October 2003   (4)  Arimidex started in October 2003, discontinued January 2019  (5) osteopenia, currently receiving zoledronic acid annually, with first dose on 10/26/2011  (a) bone density 12/14/2015 showed a T score of -1.7 (down from -1.22 years prior)  (b) repeat bone density 01/06/2018 shows a T score of -2.5 (osteoporosis)  (c) switched to denosumab/Prolia beginning April 2020  (6) lung nodule noted 2016, followed at [facility], next chest CT scan scheduled December 2021  (7) Parkinsons diagnosed 07/2019   PLAN:  Dima is here today for follow up of her history of breast cancer and osteopenia.  She has no clinical or radiographic sign of breast cancer recurrence.  She will continue to have mammograms annually.  She does have very dense bresats, however with the current parkinsons work up, we will wait on ordering any supplemental breast imaging at this time.    Kamelia will continue on Prolia every 6 months for her osteopenia. We discussed bone health including calcium, vitamin d, and weight bearing exercises.    I asked that [facility] call us back with the information about her neurologist that she is seeing for her Parkinsons, so that we can be in communications if needed.  She is going to call us with that information.  She notes that her neurologist has attributed her weight loss to the parkinsons, and not another clinical issue.  She will return to see [place] in 6 months for labs, f/u and Prolia.  She knows to call for any questions that may arise between now and her next appointment.  We are happy to see her sooner if needed.  Total encounter time: 20 minutes*   Wilber Bihari, NP 07/27/19 11:35  AM [facility] [place], [place] [address] Tel. [phone]    Fax. [phone]   *Total Encounter Time as defined by the Centers for Medicare and Medicaid Services includes, in addition to the face-to-face time of a patient visit (documented in the note above) non-face-to-face time: obtaining and reviewing outside history, ordering and reviewing medications, tests or procedures, care coordination (communications with other health care professionals or caregivers) and documentation in the medical record.

## 2019-07-27 ENCOUNTER — Inpatient Hospital Stay: Payer: Medicare Other

## 2019-07-27 ENCOUNTER — Inpatient Hospital Stay: Payer: Medicare Other | Attending: Adult Health

## 2019-07-27 ENCOUNTER — Other Ambulatory Visit: Payer: Self-pay

## 2019-07-27 ENCOUNTER — Inpatient Hospital Stay (HOSPITAL_BASED_OUTPATIENT_CLINIC_OR_DEPARTMENT_OTHER): Payer: Medicare Other | Admitting: Adult Health

## 2019-07-27 ENCOUNTER — Encounter: Payer: Self-pay | Admitting: Adult Health

## 2019-07-27 VITALS — BP 142/75 | HR 58 | Temp 98.3°F | Resp 18 | Ht 65.0 in | Wt 122.5 lb

## 2019-07-27 DIAGNOSIS — C50912 Malignant neoplasm of unspecified site of left female breast: Secondary | ICD-10-CM | POA: Diagnosis present

## 2019-07-27 DIAGNOSIS — Z79899 Other long term (current) drug therapy: Secondary | ICD-10-CM | POA: Diagnosis not present

## 2019-07-27 DIAGNOSIS — E559 Vitamin D deficiency, unspecified: Secondary | ICD-10-CM | POA: Diagnosis not present

## 2019-07-27 DIAGNOSIS — M810 Age-related osteoporosis without current pathological fracture: Secondary | ICD-10-CM | POA: Diagnosis not present

## 2019-07-27 DIAGNOSIS — I10 Essential (primary) hypertension: Secondary | ICD-10-CM | POA: Insufficient documentation

## 2019-07-27 DIAGNOSIS — E039 Hypothyroidism, unspecified: Secondary | ICD-10-CM | POA: Diagnosis not present

## 2019-07-27 DIAGNOSIS — Z170 Estrogen receptor positive status [ER+]: Secondary | ICD-10-CM

## 2019-07-27 DIAGNOSIS — Z803 Family history of malignant neoplasm of breast: Secondary | ICD-10-CM | POA: Diagnosis not present

## 2019-07-27 DIAGNOSIS — G20 Parkinson's disease: Secondary | ICD-10-CM | POA: Insufficient documentation

## 2019-07-27 DIAGNOSIS — C50512 Malignant neoplasm of lower-outer quadrant of left female breast: Secondary | ICD-10-CM | POA: Diagnosis not present

## 2019-07-27 DIAGNOSIS — M8580 Other specified disorders of bone density and structure, unspecified site: Secondary | ICD-10-CM

## 2019-07-27 DIAGNOSIS — Z7982 Long term (current) use of aspirin: Secondary | ICD-10-CM | POA: Insufficient documentation

## 2019-07-27 DIAGNOSIS — Z780 Asymptomatic menopausal state: Secondary | ICD-10-CM

## 2019-07-27 DIAGNOSIS — Z923 Personal history of irradiation: Secondary | ICD-10-CM | POA: Diagnosis not present

## 2019-07-27 DIAGNOSIS — M818 Other osteoporosis without current pathological fracture: Secondary | ICD-10-CM

## 2019-07-27 LAB — COMPREHENSIVE METABOLIC PANEL
ALT: 7 U/L (ref 0–44)
AST: 16 U/L (ref 15–41)
Albumin: 3.9 g/dL (ref 3.5–5.0)
Alkaline Phosphatase: 89 U/L (ref 38–126)
Anion gap: 7 (ref 5–15)
BUN: 21 mg/dL (ref 8–23)
CO2: 30 mmol/L (ref 22–32)
Calcium: 10 mg/dL (ref 8.9–10.3)
Chloride: 100 mmol/L (ref 98–111)
Creatinine, Ser: 1.2 mg/dL — ABNORMAL HIGH (ref 0.44–1.00)
GFR calc Af Amer: 49 mL/min — ABNORMAL LOW (ref >60)
GFR calc non Af Amer: 43 mL/min — ABNORMAL LOW (ref >60)
Glucose, Bld: 99 mg/dL (ref 70–99)
Potassium: 4 mmol/L (ref 3.5–5.1)
Sodium: 137 mmol/L (ref 135–145)
Total Bilirubin: 0.3 mg/dL (ref 0.3–1.2)
Total Protein: 6.6 g/dL (ref 6.5–8.1)

## 2019-07-27 LAB — CBC WITH DIFFERENTIAL/PLATELET
Abs Immature Granulocytes: 0.02 10*3/uL (ref 0.00–0.07)
Basophils Absolute: 0 10*3/uL (ref 0.0–0.1)
Basophils Relative: 1 %
Eosinophils Absolute: 0.2 10*3/uL (ref 0.0–0.5)
Eosinophils Relative: 2 %
HCT: 38.5 % (ref 36.0–46.0)
Hemoglobin: 12.2 g/dL (ref 12.0–15.0)
Immature Granulocytes: 0 %
Lymphocytes Relative: 26 %
Lymphs Abs: 2 10*3/uL (ref 0.7–4.0)
MCH: 29.9 pg (ref 26.0–34.0)
MCHC: 31.7 g/dL (ref 30.0–36.0)
MCV: 94.4 fL (ref 80.0–100.0)
Monocytes Absolute: 0.7 10*3/uL (ref 0.1–1.0)
Monocytes Relative: 10 %
Neutro Abs: 4.6 10*3/uL (ref 1.7–7.7)
Neutrophils Relative %: 61 %
Platelets: 188 10*3/uL (ref 150–400)
RBC: 4.08 MIL/uL (ref 3.87–5.11)
RDW: 13 % (ref 11.5–15.5)
WBC: 7.6 10*3/uL (ref 4.0–10.5)
nRBC: 0 % (ref 0.0–0.2)

## 2019-07-27 MED ORDER — DENOSUMAB 60 MG/ML ~~LOC~~ SOSY
PREFILLED_SYRINGE | SUBCUTANEOUS | Status: AC
  Filled 2019-07-27: qty 1

## 2019-07-27 MED ORDER — DENOSUMAB 60 MG/ML ~~LOC~~ SOSY
60.0000 mg | PREFILLED_SYRINGE | Freq: Once | SUBCUTANEOUS | Status: AC
  Administered 2019-07-27: 60 mg via SUBCUTANEOUS

## 2019-07-28 ENCOUNTER — Telehealth: Payer: Self-pay | Admitting: Adult Health

## 2019-07-28 NOTE — Telephone Encounter (Signed)
Scheduled appts per 7/20 los. Pt confirmed appt date and time.

## 2020-01-12 ENCOUNTER — Other Ambulatory Visit: Payer: Self-pay | Admitting: Oncology

## 2020-01-12 DIAGNOSIS — Z1231 Encounter for screening mammogram for malignant neoplasm of breast: Secondary | ICD-10-CM

## 2020-01-26 NOTE — Progress Notes (Signed)
[place]  [phone] [phone]    ID: Catherine Carlson   DOB: [birth year]  MR#: [id number]  ZDG#:[id number]  Patient Care Team: Birder Robson., MD as PCP - General (Internal Medicine) Letrice Pollok, Virgie Dad, MD (Hematology and Oncology) Gery Pray, MD as Consulting Physician (Radiation Oncology) Neldon Mc, MD as Consulting Physician (General Surgery) Snapper, Laban Emperor, MD as Consulting Physician (Pulmonary Disease) Bartolo Darter, MD as Referring Physician (Cardiology) Ihor Dow, MD as Referring Physician (Pulmonary Disease)  OTHER: Lind Guest PA (internal medicine)  CHIEF COMPLAINT:  Left Breast Cancer  CURRENT TREATMENT: Denosumab/Prolia   INTERVAL HISTORY: Catherine Carlson returns today for follow-up of her left breast cancer.  She continues under observation.  Aitanna's last bone density screening on 01/06/2018, showed a T-score of -2.5, which is considered osteoporotic. She receives Prolia every 6 months for her osteoporosis, with the most recent dose 07/27/2019.  She is scheduled for annual screening mammogram at the [facility] on 02/17/2020.   REVIEW OF SYSTEMS: Catherine Carlson had several falls and injured her teeth as well as cracked some ribs.  She also had significant weight loss and other issues which were difficult to interpret but she was referred to [facility] neurology by Dr. Laverta Baltimore and she has been diagnosed with Parkinson's disease.  She is taking Sinemet and she is tolerating that remarkably well.  She tells me that since she started the Sinemet and since she started using a cane with a 4-prong base there have been no further falls.   COVID 19 VACCINATION STATUS: Status post Moderna x2 with booster in September 2021   HISTORY OF PRESENT ILLNESS:   From the original intake note:    Catherine Carlson is a 80 year old female who early in 2003, noticed approximately a pea-sized nodule in the lower outer quadrant of her  left breast.  She routinely performed self-breast examinations, and this was a new finding for her. She denies any pain in the breast area, nipple discharge or bleeding. The patient had undergone a routine screening mammogram at [facility] on April 22, 2001 which showed no suspicious areas in either breast.  The patient proceeded to be seen by Dr. March Rummage, who palpated a lesion in the 4 o'clock position of the breast which was very close to the skin.  Ultrasound of this area revealed it was not a simple cyst.  In light of the above findings, the patient proceeded to undergo excisional biopsy with pathology returning invasive ductal carcinoma, Grade 2 out of 3. 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  The tumor did focally involve the superficial margin. In light of this, the patient proceeded to undergo reexcision.  In addition, at the same time, she underwent a sentinel node procedure. The patient was found to have no residual carcinoma in the reexcision.  The sentinel node specimen revealed no evidence of metastatic disease.  Her subsequent history is as detailed below   PAST MEDICAL HISTORY: Past Medical History:  Diagnosis Date  . Breast cancer ([place]) 2003   left  . Cancer ([place])   . Family history of breast cancer    PGM  . Hypertension   . Hypothyroidism   . Osteopenia   . Personal history of radiation therapy   . Seizures ([place])   . Tremor of both hands   . 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  The patient was previously on Synthroid in the past for thyroid problems but none currently.    PAST SURGICAL HISTORY: Past Surgical History:  Procedure Laterality Date  . ABDOMINAL HYSTERECTOMY  1970  . BREAST LUMPECTOMY  06/10/2001   Left - Dr Mertha Finders  . CATARACT EXTRACTION W/PHACO Left  07/28/2015   Procedure: CATARACT EXTRACTION PHACO AND INTRAOCULAR LENS PLACEMENT (IOC);  Surgeon: Eulogio Bear, MD;  Location: [facility] ORS;  Service: Ophthalmology;  Laterality: Left;  [place] 30.3 AP% 12.0 CDE 3.66 FLUID PACK LOT # [id number] H  . EYE SURGERY    . THYROIDECTOMY    The patient underwent a hysterectomy in 1973 for endometriosis. The patient has retained one ovary.   FAMILY HISTORY Family History  Problem Relation Age of Onset  . Heart disease Mother   . Heart disease Father   . Breast cancer Maternal Grandmother   Significant for paternal grandmother with history of breast cancer, diagnosed at age 9.   There is no other family history of breast or gynecologic malignancies or other type malignancies.     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 Her husband of 89+ years, Mallie Catherine Carlson, worked as a Architectural technologist for a [employer]. They have a son whom they adopted at age 9 months,, Catherine Carlson, 35, who runs the patient's farm.  He lives on the same property and pretty much looks after Catherine Carlson and her husband daily.  They grow tobacco, soybean's, and wheat. She has 2 grandsons, the older 1 already at [facility] state.  The patient is a Tourist information centre manager.    ADVANCED DIRECTIVES: in place   HEALTH MAINTENANCE:  Social History   Tobacco Use  . Smoking status: Never Smoker  . Smokeless tobacco: Never Used  Substance Use Topics  . Alcohol use: No  . Drug use: No     Colonoscopy: 2012/[facility]  PAP: Status post hysterectomy  Bone density: July 2013, osteopenia  Lipid panel: Dr. Ronnald Ramp  Allergies  Allergen Reactions  . Morphine And Related Itching, Nausea And Vomiting and Other (See Comments)    Caused GI upset. Also caused patient to black out. Unaware of what was going on around her. Need help to move around.  . Penicillins Anaphylaxis and Hives    Hives go around the neck Has patient had a  PCN reaction causing immediate rash, facial/tongue/throat swelling, SOB or lightheadedness with hypotension: Yes Has patient had a PCN reaction causing severe rash involving mucus membranes or skin necrosis: Yes Has patient had a PCN reaction that required hospitalization No Has patient had a PCN reaction occurring within the last 10 years: No If all of the above answers are "NO", then may proceed with Cephalosporin use.   . Codeine Nausea And Vomiting    Current Outpatient Medications  Medication Sig Dispense Refill  . amLODipine (NORVASC) 10 MG tablet Take 10 mg by mouth daily.    Marland Kitchen aspirin 81 MG tablet Take 81 mg by mouth daily.      . calcium citrate (CALCITRATE - DOSED IN MG ELEMENTAL CALCIUM) 950 MG tablet Take 1 tablet by mouth daily.      . carbamazepine (TEGRETOL) 200 MG tablet Take 400 mg by mouth at bedtime.     . carbidopa-levodopa (SINEMET IR) 25-100 MG tablet Take 1 tablet by mouth 3 (three) times daily.    . carvedilol (  COREG) 12.5 MG tablet Take 12.5 mg by mouth 2 (two) times daily with a meal.     . cholecalciferol (VITAMIN D) 1000 UNITS tablet Take 2,000 Units by mouth daily.     Marland Kitchen escitalopram (LEXAPRO) 10 MG tablet Take 10 mg by mouth daily.    Marland Kitchen levothyroxine (SYNTHROID, LEVOTHROID) 112 MCG tablet Take 112 mcg by mouth daily before breakfast.    . lisinopril (ZESTRIL) 5 MG tablet Take 5 mg by mouth daily.    Marland Kitchen triamterene-hydrochlorothiazide (DYAZIDE) 37.5-25 MG capsule Take 1 capsule by mouth daily.     No current facility-administered medications for this visit.    OBJECTIVE: White woman who appears stated age  2:   01/27/20 0958  BP: (!) 154/83  Pulse: 74  Resp: 18  Temp: (!) 97.3 F (36.3 C)  SpO2: 99%     Body mass index is 19.47 kg/m.    ECOG FS: 1 Filed Weights   01/27/20 0958  Weight: 117 lb (53.1 kg)    Sclerae unicteric, EOMs intact Wearing a mask; significant hearing loss No cervical or supraclavicular adenopathy Lungs no rales or  rhonchi Heart regular rate and rhythm Abd soft, nontender, positive bowel sounds MSK no focal spinal tenderness, no upper extremity lymphedema Neuro: nonfocal, no evidence of tremor, well oriented, appropriate affect Breasts: The right breast is lumpy but otherwise benign.  The left breast is also lumpy and status post radiation and surgery.  There is no evidence of local recurrence.  Both axillae are benign.   LAB RESULTS: Lab Results  Component Value Date   WBC 6.8 01/27/2020   NEUTROABS 4.8 01/27/2020   HGB 12.3 01/27/2020   HCT 37.6 01/27/2020   MCV 95.2 01/27/2020   PLT 173 01/27/2020      Chemistry      Component Value Date/Time   NA 137 07/27/2019 1025   NA 140 05/02/2016 0911   K 4.0 07/27/2019 1025   K 3.8 05/02/2016 0911   CL 100 07/27/2019 1025   CL 98 10/15/2011 1342   CO2 30 07/27/2019 1025   CO2 24 05/02/2016 0911   BUN 21 07/27/2019 1025   BUN 24.5 05/02/2016 0911   CREATININE 1.20 (H) 07/27/2019 1025   CREATININE 1.17 (H) 12/22/2018 1229   CREATININE 1.1 05/02/2016 0911      Component Value Date/Time   CALCIUM 10.0 07/27/2019 1025   CALCIUM 9.9 05/02/2016 0911   ALKPHOS 89 07/27/2019 1025   ALKPHOS 89 12/05/2015 0938   AST 16 07/27/2019 1025   AST 20 12/22/2018 1229   AST 13 12/05/2015 0938   ALT 7 07/27/2019 1025   ALT 15 12/22/2018 1229   ALT 9 12/05/2015 0938   BILITOT 0.3 07/27/2019 1025   BILITOT 0.3 12/22/2018 1229   BILITOT 0.22 12/05/2015 0938      STUDIES: No results found.    ASSESSMENT: 80 y.o. [place] woman   (1)  status post left lumpectomy and sentinel lymph node biopsy June 2003 for a T2 N0 invasive ductal carcinoma, ER positive, PR and HER2 negative,   (2)  status post Cytoxan and Adriamycin times four  (3)  Received radiation therapy, completed October 2003   (4)  Arimidex started in October 2003, discontinued January 2019  (5) osteopenia, currently receiving zoledronic acid annually, with first dose on  10/26/2011  (a) bone density 12/14/2015 showed a T score of -1.7 (down from -1.22 years prior)  (b) repeat bone density 01/06/2018 shows a T score of -2.5 (  osteoporosis)  (c) switched to denosumab/Prolia beginning April 2020  (6) lung nodule noted 2016, followed at [facility], next chest CT scan scheduled December 2021  (7) Parkinsons diagnosed 07/2019   PLAN:  Shizue will receive Prolia today for her osteoporosis.  She has been on treatment for many years and it will be helpful to obtain a bone density.  We can do this at the same time as her mammogram 02/17/2020.  The bone density significantly improved perhaps we can stop the Prolia.  Otherwise she will return to see me in 35month.  Total encounter time 20 minutes.*Sarajane JewsC. Liviana Mills, MD 01/27/20 10:12 AM Medical Oncology and Hematology [facility]ona [address] [address]Tel. [phone]   Fax. [phone]  I, KWilburn Mylar am acting as scribe for Dr. GVirgie Dad Thoms Barthelemy.  I, GLurline DelMD, have reviewed the above documentation for accuracy and completeness, and I agree with the above.    *Total Encounter Time as defined by the Centers for Medicare and Medicaid Services includes, in addition to the face-to-face time of a patient visit (documented in the note above) non-face-to-face time: obtaining and reviewing outside history, ordering and reviewing medications, tests or procedures, care coordination (communications with other health care professionals or caregivers) and documentation in the medical record.

## 2020-01-27 ENCOUNTER — Inpatient Hospital Stay: Payer: Medicare Other

## 2020-01-27 ENCOUNTER — Inpatient Hospital Stay: Payer: Medicare Other | Attending: Oncology

## 2020-01-27 ENCOUNTER — Inpatient Hospital Stay (HOSPITAL_BASED_OUTPATIENT_CLINIC_OR_DEPARTMENT_OTHER): Payer: Medicare Other | Admitting: Oncology

## 2020-01-27 ENCOUNTER — Other Ambulatory Visit: Payer: Self-pay

## 2020-01-27 VITALS — BP 154/83 | HR 74 | Temp 97.3°F | Resp 18 | Wt 117.0 lb

## 2020-01-27 DIAGNOSIS — E559 Vitamin D deficiency, unspecified: Secondary | ICD-10-CM | POA: Diagnosis not present

## 2020-01-27 DIAGNOSIS — I10 Essential (primary) hypertension: Secondary | ICD-10-CM | POA: Diagnosis not present

## 2020-01-27 DIAGNOSIS — M810 Age-related osteoporosis without current pathological fracture: Secondary | ICD-10-CM | POA: Insufficient documentation

## 2020-01-27 DIAGNOSIS — G20A1 Parkinson's disease without dyskinesia, without mention of fluctuations: Secondary | ICD-10-CM | POA: Insufficient documentation

## 2020-01-27 DIAGNOSIS — G20 Parkinson's disease: Secondary | ICD-10-CM | POA: Diagnosis not present

## 2020-01-27 DIAGNOSIS — Z8669 Personal history of other diseases of the nervous system and sense organs: Secondary | ICD-10-CM | POA: Insufficient documentation

## 2020-01-27 DIAGNOSIS — R911 Solitary pulmonary nodule: Secondary | ICD-10-CM | POA: Diagnosis not present

## 2020-01-27 DIAGNOSIS — M8580 Other specified disorders of bone density and structure, unspecified site: Secondary | ICD-10-CM | POA: Diagnosis not present

## 2020-01-27 DIAGNOSIS — C50512 Malignant neoplasm of lower-outer quadrant of left female breast: Secondary | ICD-10-CM

## 2020-01-27 DIAGNOSIS — Z803 Family history of malignant neoplasm of breast: Secondary | ICD-10-CM | POA: Diagnosis not present

## 2020-01-27 DIAGNOSIS — Z170 Estrogen receptor positive status [ER+]: Secondary | ICD-10-CM | POA: Diagnosis not present

## 2020-01-27 DIAGNOSIS — Z79899 Other long term (current) drug therapy: Secondary | ICD-10-CM | POA: Insufficient documentation

## 2020-01-27 DIAGNOSIS — Z923 Personal history of irradiation: Secondary | ICD-10-CM | POA: Diagnosis not present

## 2020-01-27 DIAGNOSIS — M818 Other osteoporosis without current pathological fracture: Secondary | ICD-10-CM

## 2020-01-27 DIAGNOSIS — R634 Abnormal weight loss: Secondary | ICD-10-CM | POA: Diagnosis not present

## 2020-01-27 DIAGNOSIS — Z7982 Long term (current) use of aspirin: Secondary | ICD-10-CM | POA: Diagnosis not present

## 2020-01-27 DIAGNOSIS — Z780 Asymptomatic menopausal state: Secondary | ICD-10-CM

## 2020-01-27 LAB — COMPREHENSIVE METABOLIC PANEL
ALT: <6 U/L (ref 0–44)
AST: 13 U/L — ABNORMAL LOW (ref 15–41)
Albumin: 4 g/dL (ref 3.5–5.0)
Alkaline Phosphatase: 60 U/L (ref 38–126)
Anion gap: 9 (ref 5–15)
BUN: 24 mg/dL — ABNORMAL HIGH (ref 8–23)
CO2: 30 mmol/L (ref 22–32)
Calcium: 9.4 mg/dL (ref 8.9–10.3)
Chloride: 100 mmol/L (ref 98–111)
Creatinine, Ser: 1.16 mg/dL — ABNORMAL HIGH (ref 0.44–1.00)
GFR, Estimated: 47 mL/min — ABNORMAL LOW (ref >60)
Glucose, Bld: 113 mg/dL — ABNORMAL HIGH (ref 70–99)
Potassium: 3.5 mmol/L (ref 3.5–5.1)
Sodium: 139 mmol/L (ref 135–145)
Total Bilirubin: 0.3 mg/dL (ref 0.3–1.2)
Total Protein: 6.7 g/dL (ref 6.5–8.1)

## 2020-01-27 LAB — CBC WITH DIFFERENTIAL/PLATELET
Abs Immature Granulocytes: 0.03 10*3/uL (ref 0.00–0.07)
Basophils Absolute: 0 10*3/uL (ref 0.0–0.1)
Basophils Relative: 0 %
Eosinophils Absolute: 0.1 10*3/uL (ref 0.0–0.5)
Eosinophils Relative: 2 %
HCT: 37.6 % (ref 36.0–46.0)
Hemoglobin: 12.3 g/dL (ref 12.0–15.0)
Immature Granulocytes: 0 %
Lymphocytes Relative: 18 %
Lymphs Abs: 1.2 10*3/uL (ref 0.7–4.0)
MCH: 31.1 pg (ref 26.0–34.0)
MCHC: 32.7 g/dL (ref 30.0–36.0)
MCV: 95.2 fL (ref 80.0–100.0)
Monocytes Absolute: 0.6 10*3/uL (ref 0.1–1.0)
Monocytes Relative: 9 %
Neutro Abs: 4.8 10*3/uL (ref 1.7–7.7)
Neutrophils Relative %: 71 %
Platelets: 173 10*3/uL (ref 150–400)
RBC: 3.95 MIL/uL (ref 3.87–5.11)
RDW: 13.2 % (ref 11.5–15.5)
WBC: 6.8 10*3/uL (ref 4.0–10.5)
nRBC: 0 % (ref 0.0–0.2)

## 2020-01-27 MED ORDER — DENOSUMAB 60 MG/ML ~~LOC~~ SOSY
60.0000 mg | PREFILLED_SYRINGE | Freq: Once | SUBCUTANEOUS | Status: AC
  Administered 2020-01-27: 60 mg via SUBCUTANEOUS

## 2020-01-27 MED ORDER — DENOSUMAB 60 MG/ML ~~LOC~~ SOSY
PREFILLED_SYRINGE | SUBCUTANEOUS | Status: AC
  Filled 2020-01-27: qty 1

## 2020-01-27 NOTE — Patient Instructions (Signed)
Denosumab injection What is this medicine? DENOSUMAB (den oh sue mab) slows bone breakdown. Prolia is used to treat osteoporosis in women after menopause and in men, and in people who are taking corticosteroids for 6 months or more. Xgeva is used to treat a high calcium level due to cancer and to prevent bone fractures and other bone problems caused by multiple myeloma or cancer bone metastases. Xgeva is also used to treat giant cell tumor of the bone. This medicine may be used for other purposes; ask your health care provider or pharmacist if you have questions. COMMON BRAND NAME(S): Prolia, XGEVA What should I tell my health care provider before I take this medicine? They need to know if you have any of these conditions:  dental disease  having surgery or tooth extraction  infection  kidney disease  low levels of calcium or Vitamin D in the blood  malnutrition  on hemodialysis  skin conditions or sensitivity  thyroid or parathyroid disease  an unusual reaction to denosumab, other medicines, foods, dyes, or preservatives  pregnant or trying to get pregnant  breast-feeding How should I use this medicine? This medicine is for injection under the skin. It is given by a health care professional in a hospital or clinic setting. A special MedGuide will be given to you before each treatment. Be sure to read this information carefully each time. For Prolia, talk to your pediatrician regarding the use of this medicine in children. Special care may be needed. For Xgeva, talk to your pediatrician regarding the use of this medicine in children. While this drug may be prescribed for children as young as 13 years for selected conditions, precautions do apply. Overdosage: If you think you have taken too much of this medicine contact a poison control center or emergency room at once. NOTE: This medicine is only for you. Do not share this medicine with others. What if I miss a dose? It is  important not to miss your dose. Call your doctor or health care professional if you are unable to keep an appointment. What may interact with this medicine? Do not take this medicine with any of the following medications:  other medicines containing denosumab This medicine may also interact with the following medications:  medicines that lower your chance of fighting infection  steroid medicines like prednisone or cortisone This list may not describe all possible interactions. Give your health care provider a list of all the medicines, herbs, non-prescription drugs, or dietary supplements you use. Also tell them if you smoke, drink alcohol, or use illegal drugs. Some items may interact with your medicine. What should I watch for while using this medicine? Visit your doctor or health care professional for regular checks on your progress. Your doctor or health care professional may order blood tests and other tests to see how you are doing. Call your doctor or health care professional for advice if you get a fever, chills or sore throat, or other symptoms of a cold or flu. Do not treat yourself. This drug may decrease your body's ability to fight infection. Try to avoid being around people who are sick. You should make sure you get enough calcium and vitamin D while you are taking this medicine, unless your doctor tells you not to. Discuss the foods you eat and the vitamins you take with your health care professional. See your dentist regularly. Brush and floss your teeth as directed. Before you have any dental work done, tell your dentist you are   receiving this medicine. Do not become pregnant while taking this medicine or for 5 months after stopping it. Talk with your doctor or health care professional about your birth control options while taking this medicine. Women should inform their doctor if they wish to become pregnant or think they might be pregnant. There is a potential for serious side  effects to an unborn child. Talk to your health care professional or pharmacist for more information. What side effects may I notice from receiving this medicine? Side effects that you should report to your doctor or health care professional as soon as possible:  allergic reactions like skin rash, itching or hives, swelling of the face, lips, or tongue  bone pain  breathing problems  dizziness  jaw pain, especially after dental work  redness, blistering, peeling of the skin  signs and symptoms of infection like fever or chills; cough; sore throat; pain or trouble passing urine  signs of low calcium like fast heartbeat, muscle cramps or muscle pain; pain, tingling, numbness in the hands or feet; seizures  unusual bleeding or bruising  unusually weak or tired Side effects that usually do not require medical attention (report to your doctor or health care professional if they continue or are bothersome):  constipation  diarrhea  headache  joint pain  loss of appetite  muscle pain  runny nose  tiredness  upset stomach This list may not describe all possible side effects. Call your doctor for medical advice about side effects. You may report side effects to FDA at 1-800-FDA-1088. Where should I keep my medicine? This medicine is only given in a clinic, doctor's office, or other health care setting and will not be stored at home. NOTE: This sheet is a summary. It may not cover all possible information. If you have questions about this medicine, talk to your doctor, pharmacist, or health care provider.  2021 Elsevier/Gold Standard (2017-05-03 16:10:44)

## 2020-01-29 ENCOUNTER — Telehealth: Payer: Self-pay | Admitting: Oncology

## 2020-01-29 NOTE — Telephone Encounter (Signed)
Scheduled appts per 1/19 los. Pt confirmed appt date and time.  

## 2020-02-17 ENCOUNTER — Ambulatory Visit
Admission: RE | Admit: 2020-02-17 | Discharge: 2020-02-17 | Disposition: A | Discharge status: Home / Self Care | Payer: Medicare Other | Source: Ambulatory Visit | Attending: Oncology | Admitting: Oncology

## 2020-02-17 ENCOUNTER — Other Ambulatory Visit: Payer: Self-pay

## 2020-02-17 DIAGNOSIS — Z1231 Encounter for screening mammogram for malignant neoplasm of breast: Secondary | ICD-10-CM

## 2020-08-03 ENCOUNTER — Inpatient Hospital Stay: Payer: Medicare Other

## 2020-08-03 ENCOUNTER — Other Ambulatory Visit: Payer: Self-pay

## 2020-08-03 ENCOUNTER — Inpatient Hospital Stay: Payer: Medicare Other | Attending: Oncology

## 2020-08-03 ENCOUNTER — Inpatient Hospital Stay (HOSPITAL_BASED_OUTPATIENT_CLINIC_OR_DEPARTMENT_OTHER): Payer: Medicare Other | Admitting: Oncology

## 2020-08-03 VITALS — BP 105/58 | HR 65 | Temp 97.2°F | Resp 18 | Wt 111.1 lb

## 2020-08-03 DIAGNOSIS — Z885 Allergy status to narcotic agent status: Secondary | ICD-10-CM | POA: Insufficient documentation

## 2020-08-03 DIAGNOSIS — Z79899 Other long term (current) drug therapy: Secondary | ICD-10-CM | POA: Insufficient documentation

## 2020-08-03 DIAGNOSIS — C50512 Malignant neoplasm of lower-outer quadrant of left female breast: Secondary | ICD-10-CM

## 2020-08-03 DIAGNOSIS — E559 Vitamin D deficiency, unspecified: Secondary | ICD-10-CM

## 2020-08-03 DIAGNOSIS — M818 Other osteoporosis without current pathological fracture: Secondary | ICD-10-CM

## 2020-08-03 DIAGNOSIS — Z780 Asymptomatic menopausal state: Secondary | ICD-10-CM

## 2020-08-03 DIAGNOSIS — M810 Age-related osteoporosis without current pathological fracture: Secondary | ICD-10-CM | POA: Insufficient documentation

## 2020-08-03 DIAGNOSIS — Z8249 Family history of ischemic heart disease and other diseases of the circulatory system: Secondary | ICD-10-CM | POA: Insufficient documentation

## 2020-08-03 DIAGNOSIS — G20 Parkinson's disease: Secondary | ICD-10-CM | POA: Diagnosis not present

## 2020-08-03 DIAGNOSIS — Z880 Allergy status to penicillin: Secondary | ICD-10-CM | POA: Diagnosis not present

## 2020-08-03 DIAGNOSIS — Z170 Estrogen receptor positive status [ER+]: Secondary | ICD-10-CM

## 2020-08-03 DIAGNOSIS — I10 Essential (primary) hypertension: Secondary | ICD-10-CM | POA: Diagnosis not present

## 2020-08-03 DIAGNOSIS — Z90721 Acquired absence of ovaries, unilateral: Secondary | ICD-10-CM | POA: Diagnosis not present

## 2020-08-03 DIAGNOSIS — Z923 Personal history of irradiation: Secondary | ICD-10-CM | POA: Diagnosis not present

## 2020-08-03 DIAGNOSIS — M8580 Other specified disorders of bone density and structure, unspecified site: Secondary | ICD-10-CM

## 2020-08-03 DIAGNOSIS — Z803 Family history of malignant neoplasm of breast: Secondary | ICD-10-CM | POA: Diagnosis not present

## 2020-08-03 LAB — CBC WITH DIFFERENTIAL/PLATELET
Abs Immature Granulocytes: 0.03 10*3/uL (ref 0.00–0.07)
Basophils Absolute: 0 10*3/uL (ref 0.0–0.1)
Basophils Relative: 0 %
Eosinophils Absolute: 0.1 10*3/uL (ref 0.0–0.5)
Eosinophils Relative: 2 %
HCT: 35.7 % — ABNORMAL LOW (ref 36.0–46.0)
Hemoglobin: 11.8 g/dL — ABNORMAL LOW (ref 12.0–15.0)
Immature Granulocytes: 0 %
Lymphocytes Relative: 21 %
Lymphs Abs: 1.4 10*3/uL (ref 0.7–4.0)
MCH: 30.9 pg (ref 26.0–34.0)
MCHC: 33.1 g/dL (ref 30.0–36.0)
MCV: 93.5 fL (ref 80.0–100.0)
Monocytes Absolute: 0.6 10*3/uL (ref 0.1–1.0)
Monocytes Relative: 9 %
Neutro Abs: 4.7 10*3/uL (ref 1.7–7.7)
Neutrophils Relative %: 68 %
Platelets: 204 10*3/uL (ref 150–400)
RBC: 3.82 MIL/uL — ABNORMAL LOW (ref 3.87–5.11)
RDW: 12.9 % (ref 11.5–15.5)
WBC: 6.8 10*3/uL (ref 4.0–10.5)
nRBC: 0 % (ref 0.0–0.2)

## 2020-08-03 LAB — COMPREHENSIVE METABOLIC PANEL
ALT: <6 U/L (ref 0–44)
AST: 13 U/L — ABNORMAL LOW (ref 15–41)
Albumin: 3.8 g/dL (ref 3.5–5.0)
Alkaline Phosphatase: 60 U/L (ref 38–126)
Anion gap: 9 (ref 5–15)
BUN: 28 mg/dL — ABNORMAL HIGH (ref 8–23)
CO2: 31 mmol/L (ref 22–32)
Calcium: 9.9 mg/dL (ref 8.9–10.3)
Chloride: 95 mmol/L — ABNORMAL LOW (ref 98–111)
Creatinine, Ser: 1.34 mg/dL — ABNORMAL HIGH (ref 0.44–1.00)
GFR, Estimated: 40 mL/min — ABNORMAL LOW (ref >60)
Glucose, Bld: 106 mg/dL — ABNORMAL HIGH (ref 70–99)
Potassium: 4.1 mmol/L (ref 3.5–5.1)
Sodium: 135 mmol/L (ref 135–145)
Total Bilirubin: 0.4 mg/dL (ref 0.3–1.2)
Total Protein: 6.5 g/dL (ref 6.5–8.1)

## 2020-08-03 MED ORDER — DENOSUMAB 60 MG/ML ~~LOC~~ SOSY
60.0000 mg | PREFILLED_SYRINGE | Freq: Once | SUBCUTANEOUS | Status: AC
  Administered 2020-08-03: 60 mg via SUBCUTANEOUS

## 2020-08-03 MED ORDER — CYANOCOBALAMIN 500 MCG PO TABS: 500.0000 ug | ORAL_TABLET | Freq: Every day | ORAL | 4 refills | Status: AC

## 2020-08-03 MED ORDER — DENOSUMAB 60 MG/ML ~~LOC~~ SOSY
PREFILLED_SYRINGE | SUBCUTANEOUS | Status: AC
  Filled 2020-08-03: qty 1

## 2020-08-03 NOTE — Patient Instructions (Signed)
Denosumab injection What is this medication? DENOSUMAB (den oh sue mab) slows bone breakdown. Prolia is used to treat osteoporosis in women after menopause and in men, and in people who are taking corticosteroids for 6 months or more. Delton See is used to treat a high calcium level due to cancer and to prevent bone fractures and other bone problems caused by multiple myeloma or cancer bone metastases. Delton See is also used totreat giant cell tumor of the bone. This medicine may be used for other purposes; ask your health care provider orpharmacist if you have questions. COMMON BRAND NAME(S): Prolia, XGEVA What should I tell my care team before I take this medication? They need to know if you have any of these conditions: dental disease having surgery or tooth extraction infection kidney disease low levels of calcium or Vitamin D in the blood malnutrition on hemodialysis skin conditions or sensitivity thyroid or parathyroid disease an unusual reaction to denosumab, other medicines, foods, dyes, or preservatives pregnant or trying to get pregnant breast-feeding How should I use this medication? This medicine is for injection under the skin. It is given by a health careprofessional in a hospital or clinic setting. A special MedGuide will be given to you before each treatment. Be sure to readthis information carefully each time. For Prolia, talk to your pediatrician regarding the use of this medicine in children. Special care may be needed. For Delton See, talk to your pediatrician regarding the use of this medicine in children. While this drug may be prescribed for children as young as 13 years for selected conditions,precautions do apply. Overdosage: If you think you have taken too much of this medicine contact apoison control center or emergency room at once. NOTE: This medicine is only for you. Do not share this medicine with others. What if I miss a dose? It is important not to miss your dose. Call  your doctor or health careprofessional if you are unable to keep an appointment. What may interact with this medication? Do not take this medicine with any of the following medications: other medicines containing denosumab This medicine may also interact with the following medications: medicines that lower your chance of fighting infection steroid medicines like prednisone or cortisone This list may not describe all possible interactions. Give your health care provider a list of all the medicines, herbs, non-prescription drugs, or dietary supplements you use. Also tell them if you smoke, drink alcohol, or use illegaldrugs. Some items may interact with your medicine. What should I watch for while using this medication? Visit your doctor or health care professional for regular checks on your progress. Your doctor or health care professional may order blood tests andother tests to see how you are doing. Call your doctor or health care professional for advice if you get a fever, chills or sore throat, or other symptoms of a cold or flu. Do not treat yourself. This drug may decrease your body's ability to fight infection. Try toavoid being around people who are sick. You should make sure you get enough calcium and vitamin D while you are taking this medicine, unless your doctor tells you not to. Discuss the foods you eatand the vitamins you take with your health care professional. See your dentist regularly. Brush and floss your teeth as directed. Before youhave any dental work done, tell your dentist you are receiving this medicine. Do not become pregnant while taking this medicine or for 5 months after stopping it. Talk with your doctor or health care professional about your  birth control options while taking this medicine. Women should inform their doctor if they wish to become pregnant or think they might be pregnant. There is a potential for serious side effects to an unborn child. Talk to your health  careprofessional or pharmacist for more information. What side effects may I notice from receiving this medication? Side effects that you should report to your doctor or health care professionalas soon as possible: allergic reactions like skin rash, itching or hives, swelling of the face, lips, or tongue bone pain breathing problems dizziness jaw pain, especially after dental work redness, blistering, peeling of the skin signs and symptoms of infection like fever or chills; cough; sore throat; pain or trouble passing urine signs of low calcium like fast heartbeat, muscle cramps or muscle pain; pain, tingling, numbness in the hands or feet; seizures unusual bleeding or bruising unusually weak or tired Side effects that usually do not require medical attention (report to yourdoctor or health care professional if they continue or are bothersome): constipation diarrhea headache joint pain loss of appetite muscle pain runny nose tiredness upset stomach This list may not describe all possible side effects. Call your doctor for medical advice about side effects. You may report side effects to FDA at1-800-FDA-1088. Where should I keep my medication? This medicine is only given in a clinic, doctor's office, or other health caresetting and will not be stored at home. NOTE: This sheet is a summary. It may not cover all possible information. If you have questions about this medicine, talk to your doctor, pharmacist, orhealth care provider.  2022 Elsevier/Gold Standard (2017-05-03 16:10:44)

## 2020-08-03 NOTE — Progress Notes (Signed)
[place]  [phone] [phone]    ID: Catherine Carlson   DOB: 11-07-1940  MR#: [id number]  DHR#:[id number]  Patient Care Team: Birder Robson., MD as PCP - General (Internal Medicine) Londin Antone, Virgie Dad, MD (Hematology and Oncology) Gery Pray, MD as Consulting Physician (Radiation Oncology) Neldon Mc, MD as Consulting Physician (General Surgery) Snapper, Laban Emperor, MD as Consulting Physician (Pulmonary Disease) Bartolo Darter, MD as Referring Physician (Cardiology) Ihor Dow, MD as Referring Physician (Pulmonary Disease) Lenox Ahr, MD as Referring Physician (Neurology)  OTHER: Lind Guest PA (internal medicine)  CHIEF COMPLAINT:  Left Breast Cancer  CURRENT TREATMENT: Denosumab/Prolia   INTERVAL HISTORY: Catherine Carlson returns today for follow-up of her remote left breast cancer and osteoporosis.  She receives denosumab/Prolia every 6 months with a dose due today.  [place]'s last bone density screening on 01/06/2018, showed a T-score of -2.5, which is considered osteoporotic.   Since her last visit, she underwent bilateral screening mammography with tomography at [place] on 02/17/2020 showing: breast density category D; no evidence of malignancy in either breast.    REVIEW OF SYSTEMS: Berneita tells me her Parkinson's disease is moderately well controlled on her current medications.  She sees Dr. Cristie Hem in [place] regarding this.  She tells me she has been told she has congestive heart failure by her [place].  She has "no energy".  She spends a good deal of the day sitting down.  She has lost some weight and is trying to gain it back.  A detailed review of systems today was otherwise stable   COVID 19 VACCINATION STATUS: Status post Moderna x2 with booster in September 2021; had COVID July 2022   HISTORY OF PRESENT ILLNESS:   From the original intake note:    Catherine Carlson is a  80 year old female who early in 2003, noticed approximately a pea-sized nodule in the lower outer quadrant of her left breast.  She routinely performed self-breast examinations, and this was a new finding for her. She denies any pain in the breast area, nipple discharge or bleeding. The patient had undergone a routine screening mammogram at [facility] on April 22, 2001 which showed no suspicious areas in either breast.  The patient proceeded to be seen by Dr. March Rummage, who palpated a lesion in the 4 o'clock position of the breast which was very close to the skin.  Ultrasound of this area revealed it was not a simple cyst.  In light of the above findings, the patient proceeded to undergo excisional biopsy with pathology returning invasive ductal carcinoma, Grade 2 out of 3. 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  The tumor did focally involve the superficial margin. In light of this, the patient proceeded to undergo reexcision.  In addition, at the same time, she underwent a sentinel node procedure. The patient was found to have no residual carcinoma in the reexcision.  The sentinel node specimen revealed no evidence of metastatic disease.  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  The patient was previously on Synthroid in the past for thyroid problems but none currently.    PAST SURGICAL HISTORY: Past Surgical History:  Procedure Laterality Date   ABDOMINAL HYSTERECTOMY   1970   BREAST LUMPECTOMY  06/10/2001   Left - Dr Mertha Finders   CATARACT EXTRACTION W/PHACO Left 07/28/2015   Procedure: CATARACT EXTRACTION PHACO AND INTRAOCULAR LENS PLACEMENT ([place]);  Surgeon: Eulogio Bear, MD;  Location: [facility] ORS;  Service: Ophthalmology;  Laterality: Left;  [place] 30.3 AP% 12.0 CDE 3.66 FLUID PACK LOT # [id number]   EYE SURGERY     THYROIDECTOMY    The patient underwent a hysterectomy in 1973 for endometriosis. The patient has retained one ovary.   FAMILY HISTORY Family History  Problem Relation Age of Onset   Heart disease Mother    Heart disease Father    Breast cancer Maternal Grandmother   Significant for paternal grandmother with history of breast cancer, diagnosed at age 82.   There is no other family history of breast or gynecologic malignancies or other type malignancies.     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 Her husband of 76+ years, [name], worked as a Architectural technologist for a [employer]. They have a son whom they adopted at age 71 months,, Ron, 35, who runs the patient's farm.  He lives on the same property and pretty much looks after Nalah and her husband daily.  They grow tobacco, soybean's, and wheat. She has 2 grandsons, the older 1 already at [facility] state.  The patient is a [employer].    ADVANCED DIRECTIVES: in place   HEALTH MAINTENANCE:  Social History   Tobacco Use   Smoking status: Never   Smokeless tobacco: Never  Substance Use Topics   Alcohol use: No   Drug use: No     Colonoscopy: 2012/[facility]  PAP: Status post hysterectomy  Bone density: July 2013, osteopenia  Lipid panel: Dr. Ronnald Ramp  Allergies  Allergen Reactions   Morphine And Related Itching, Nausea And Vomiting and Other (See Comments)    Caused GI upset. Also caused patient to black out. Unaware of what was going on around her. Need help to move around.    Penicillins Anaphylaxis and Hives    Hives go around the neck Has patient had a PCN reaction causing immediate rash, facial/tongue/throat swelling, SOB or lightheadedness with hypotension: Yes Has patient had a PCN reaction causing severe rash involving mucus membranes or skin necrosis: Yes Has patient had a PCN reaction that required hospitalization No Has patient had a PCN reaction occurring within the last 10 years: No If all of the above answers are "NO", then may proceed with Cephalosporin use.    Codeine Nausea And Vomiting    Current Outpatient Medications  Medication Sig Dispense Refill   amLODipine (NORVASC) 10 MG tablet Take 10 mg by mouth daily.     aspirin 81 MG tablet Take 81 mg by mouth daily.       calcium citrate (CALCITRATE - DOSED IN MG ELEMENTAL CALCIUM) 950 MG tablet Take 1 tablet by mouth daily.       carbamazepine (TEGRETOL) 200 MG tablet Take 400 mg by mouth at bedtime.      carbidopa-levodopa (SINEMET IR)  25-100 MG tablet Take 1 tablet by mouth 3 (three) times daily.     carvedilol (COREG) 12.5 MG tablet Take 12.5 mg by mouth 2 (two) times daily with a meal.      cholecalciferol (VITAMIN D) 1000 UNITS tablet Take 2,000 Units by mouth daily.      escitalopram (LEXAPRO) 10 MG tablet Take 10 mg by mouth daily.     levothyroxine (SYNTHROID, LEVOTHROID) 112 MCG tablet Take 112 mcg by mouth daily before breakfast.     lisinopril (ZESTRIL) 5 MG tablet Take 5 mg by mouth daily.     triamterene-hydrochlorothiazide (DYAZIDE) 37.5-25 MG capsule Take 1 capsule by mouth daily.     No current facility-administered medications for this visit.    OBJECTIVE: White Carlson who appears frail  Vitals:   08/03/20 1410  BP: (!) 105/58  Pulse: 65  Resp: 18  Temp: (!) 97.2 F (36.2 C)  SpO2: 98%      Body mass index is 18.49 kg/m.    ECOG FS: 1 Filed Weights   08/03/20 1410  Weight: 111 lb 1.6 oz (50.4 kg)    Sclerae unicteric, EOMs intact Wearing a mask No cervical or  supraclavicular adenopathy Lungs no rales or rhonchi Heart regular rate and rhythm Abd soft, nontender, positive bowel sounds MSK no focal spinal tenderness, no upper extremity lymphedema Neuro: Resting tremor, otherwise nonfocal, well oriented, warm affect Breasts: The right breast is lumpy but otherwise benign.  The left breast, also lumpy, is status post surgery and radiation.  There is no evidence of recurrent disease.  Both axillae are benign.   LAB RESULTS: Lab Results  Component Value Date   WBC 6.8 08/03/2020   NEUTROABS 4.7 08/03/2020   HGB 11.8 (L) 08/03/2020   HCT 35.7 (L) 08/03/2020   MCV 93.5 08/03/2020   PLT 204 08/03/2020      Chemistry      Component Value Date/Time   NA 135 08/03/2020 1347   NA 140 05/02/2016 0911   K 4.1 08/03/2020 1347   K 3.8 05/02/2016 0911   CL 95 (L) 08/03/2020 1347   CL 98 10/15/2011 1342   CO2 31 08/03/2020 1347   CO2 24 05/02/2016 0911   BUN 28 (H) 08/03/2020 1347   BUN 24.5 05/02/2016 0911   CREATININE 1.34 (H) 08/03/2020 1347   CREATININE 1.17 (H) 12/22/2018 1229   CREATININE 1.1 05/02/2016 0911      Component Value Date/Time   CALCIUM 9.9 08/03/2020 1347   CALCIUM 9.9 05/02/2016 0911   ALKPHOS 60 08/03/2020 1347   ALKPHOS 89 12/05/2015 0938   AST 13 (L) 08/03/2020 1347   AST 20 12/22/2018 1229   AST 13 12/05/2015 0938   ALT <6 08/03/2020 1347   ALT 15 12/22/2018 1229   ALT 9 12/05/2015 0938   BILITOT 0.4 08/03/2020 1347   BILITOT 0.3 12/22/2018 1229   BILITOT 0.22 12/05/2015 0938      STUDIES: No results found.    ASSESSMENT: 80 y.o. Catherine Carlson   (1)  status post left lumpectomy and sentinel lymph node biopsy June 2003 for a T2 N0 invasive ductal carcinoma, ER positive, PR and HER2 negative,   (2)  status post Cytoxan and Adriamycin times four  (3)  Received radiation therapy, completed October 2003   (4)  Arimidex started in October 2003, discontinued January 2019  (5) osteopenia, currently  receiving zoledronic acid annually, with first dose on 10/26/2011  (a) bone density 12/14/2015 showed a T score  of -1.7 (down from -1.22 years prior)  (b) repeat bone density 01/06/2018 shows a T score of -2.5 (osteoporosis)  (c) switched to denosumab/Prolia beginning April 2020  (6) lung nodules noted 2016, [facility], most recent chest CT scan 04/21/2020 showing some changes with repeat in 12 months suggested  (7) Parkinsons diagnosed 07/2019   PLAN:  Catherine Carlson is now 19 years out from definitive surgery for her breast cancer with no evidence of disease recurrence.  This is very favorable.  Of course we would be comfortable releasing her to her primary care physicians but she tells me she derive significant reassurance from seeing [place] on a regular basis.  In addition she is getting Prolia here every 6 months.  Accordingly we are continuing that pattern as before.  She is interested in B12 "shots".  B12 of course can be obtained as a pill and I was glad to provide that for her.  She will have her next mammogram in February and see [place] again in March of next year with Prolia the same day  Total encounter time 20 minutes.Sarajane Jews C. Callie Facey, MD 08/03/20 2:44 PM Medical Oncology and Hematology [facility] [place], [place] [address] Tel. [phone]    Fax. [phone]   I, Wilburn Mylar, am acting as scribe for Dr. Virgie Dad. [place] Fosdick.  I, Lurline Del MD, have reviewed the above documentation for accuracy and completeness, and I agree with the above.   *Total Encounter Time as defined by the Centers for Medicare and Medicaid Services includes, in addition to the face-to-face time of a patient visit (documented in the note above) non-face-to-face time: obtaining and reviewing outside history, ordering and reviewing medications, tests or procedures, care coordination (communications with other health care professionals or caregivers) and  documentation in the medical record.

## 2020-08-04 ENCOUNTER — Telehealth: Payer: Self-pay | Admitting: Oncology

## 2020-08-04 NOTE — Telephone Encounter (Signed)
Scheduled appointment per 07/27 los. Left message.

## 2021-01-25 ENCOUNTER — Telehealth: Payer: Self-pay | Admitting: *Deleted

## 2021-01-26 NOTE — Progress Notes (Signed)
Patient Care Team: Long, Anola Gurney., MD as PCP - General (Internal Medicine) Magrinat, Virgie Dad, MD (Hematology and Oncology) Gery Pray, MD as Consulting Physician (Radiation Oncology) Neldon Mc, MD as Consulting Physician (General Surgery) Snapper, Laban Emperor, MD as Consulting Physician (Pulmonary Disease) Bartolo Darter, MD as Referring Physician (Cardiology) Ihor Dow, MD as Referring Physician (Pulmonary Disease) Lenox Ahr, MD as Referring Physician (Neurology)  DIAGNOSIS:    ICD-10-CM   1. Malignant neoplasm of lower-outer quadrant of left breast of female, estrogen receptor positive ([place])  C50.512    Z17.0       SUMMARY OF ONCOLOGIC HISTORY: Oncology History  Malignant neoplasm of lower-outer quadrant of left breast of female, estrogen receptor positive ([place])  05/2001 Breast US   Revealed abnormality and patient proceeded to excisional biopsy    06/03/2001 Surgery   Excisional biopsy of left breast mass revealing a 2.3 x 1.7 x 1.2 cm invasive ductal carcinoma, grade 2, with associated cribiform and papillary subtype features (<5% of tumor).  ER + (96%), PR -, HER2 -.  Surgical margins focally positive.   06/10/2001 Definitive Surgery   Re-excision of left breast with left axillary sentinel lymph node biospy. 1 sentinel lymph node removed and was negative.  Clear surgical margins after re-excision.    07/09/2001 Echocardiogram   Pre-chemotherapy MUGA performed revealed EF 75-78%.    2003 - 2003 Chemotherapy   Completed 4 cycles of Doxorubicin and Cyclophosphamide. [note: medical records date back to before EPIC integration].   2003 - 10/2001 Radiation Therapy   [no available records in EPIC]   10/2001 -  Anti-estrogen oral therapy   Initiated treatment with anastrazole.  Initial plan for duration of treatment was 5 years, however current plan is to continue indefinitely per Dr. Jana Hakim and patient preference.       Survivorship   Patient is eligible for Survivorship Clinic and will plan to see her in 10/2014.      CHIEF COMPLIANT: Follow-up of left breast cancer and to establish oncology care with me  INTERVAL HISTORY: Catherine Carlson is a 81 y.o. with above-mentioned history of left breast cancer. She presents to the clinic today for follow-up.  She has stopped antiestrogen therapy 2 years ago at the request of Dr. Jana Hakim.  She denies any lumps or nodules in the breast.  Her major complaint is that she has been losing significant weight and her energy levels have dwindled.  She was noted to have anemia with a hemoglobin of 11.2.  Her primary care physician was asking [place] to evaluate the cause of her anemia.  She has multiple other chronic issues including Parkinson's disease as well as chronic kidney disease and hypertension.  She has a history of congestive heart failure as well.  ALLERGIES:  is allergic to morphine and related, penicillins, and codeine.  MEDICATIONS:  Current Outpatient Medications  Medication Sig Dispense Refill   amLODipine (NORVASC) 10 MG tablet Take 10 mg by mouth daily.     aspirin 81 MG tablet Take 81 mg by mouth daily.       calcium citrate (CALCITRATE - DOSED IN MG ELEMENTAL CALCIUM) 950 MG tablet Take 1 tablet by mouth daily.       carbamazepine (TEGRETOL) 200 MG tablet Take 400 mg by mouth at bedtime.      carbidopa-levodopa (SINEMET IR) 25-100 MG tablet Take 1 tablet by mouth 3 (three) times daily.     carvedilol (COREG) 12.5 MG tablet Take 12.5  mg by mouth 2 (two) times daily with a meal.      cholecalciferol (VITAMIN D) 1000 UNITS tablet Take 2,000 Units by mouth daily.      escitalopram (LEXAPRO) 10 MG tablet Take 10 mg by mouth daily.     levothyroxine (SYNTHROID, LEVOTHROID) 112 MCG tablet Take 112 mcg by mouth daily before breakfast.     lisinopril (ZESTRIL) 5 MG tablet Take 5 mg by mouth daily.     triamterene-hydrochlorothiazide (DYAZIDE) 37.5-25 MG capsule Take 1  capsule by mouth daily.     vitamin B-12 (CYANOCOBALAMIN) 500 MCG tablet Take 1 tablet (500 mcg total) by mouth daily. 90 tablet 4   No current facility-administered medications for this visit.    PHYSICAL EXAMINATION: ECOG PERFORMANCE STATUS: 1 - Symptomatic but completely ambulatory  Vitals:   01/27/21 0917  BP: (!) 151/84  Pulse: (!) 57  Resp: 17  Temp: (!) 97.3 F (36.3 C)  SpO2: 99%   Filed Weights   01/27/21 0917  Weight: 111 lb 3.2 oz (50.4 kg)    BREAST: No palpable masses or nodules in either right or left breasts. No palpable axillary supraclavicular or infraclavicular adenopathy no breast tenderness or nipple discharge. (exam performed in the presence of a chaperone)  LABORATORY DATA:  I have reviewed the data as listed CMP Latest Ref Rng & Units 08/03/2020 01/27/2020 07/27/2019  Glucose 70 - 99 mg/dL 106(H) 113(H) 99  BUN 8 - 23 mg/dL 28(H) 24(H) 21  Creatinine 0.44 - 1.00 mg/dL 1.34(H) 1.16(H) 1.20(H)  Sodium 135 - 145 mmol/L 135 139 137  Potassium 3.5 - 5.1 mmol/L 4.1 3.5 4.0  Chloride 98 - 111 mmol/L 95(L) 100 100  CO2 22 - 32 mmol/L 31 30 30   Calcium 8.9 - 10.3 mg/dL 9.9 9.4 10.0  Total Protein 6.5 - 8.1 g/dL 6.5 6.7 6.6  Total Bilirubin 0.3 - 1.2 mg/dL 0.4 0.3 0.3  Alkaline Phos 38 - 126 U/L 60 60 89  AST 15 - 41 U/L 13(L) 13(L) 16  ALT 0 - 44 U/L <6 <6 7    Lab Results  Component Value Date   WBC 11.2 (H) 01/27/2021   HGB 11.1 (L) 01/27/2021   HCT 34.4 (L) 01/27/2021   MCV 96.9 01/27/2021   PLT 189 01/27/2021   NEUTROABS 8.6 (H) 01/27/2021    ASSESSMENT & PLAN:  Malignant neoplasm of lower-outer quadrant of left breast of female, estrogen receptor positive ([place]) 06/03/2001: Excisional biopsy of left breast mass revealing a 2.3 x 1.7 x 1.2 cm invasive ductal carcinoma, grade 2, with associated cribiform and papillary subtype features (<5% of tumor).  ER + (96%), PR -, HER2 -.  Surgical margins focally positive. 4 cycles of AC XRT Current  Treatment: Anastrozole since 10/2001 completed 2021  Anemia of chronic disease: Hemoglobin is 11.1.  She has chronic kidney disease as well as chronic congestive heart failure along with Parkinson's disease. I discussed with her that given all of these findings, her anemia is generally very mild and therefore does not require aggressive interventions like colonoscopy or bone marrow evaluation etc.  She can be watched and monitored.  If the hemoglobin goes below 10 then we can consider her for Aranesp therapy.  Return to clinic in 1 year for follow-up   No orders of the defined types were placed in this encounter.  The patient has a good understanding of the overall plan. she agrees with it. she will call with any problems that may develop before  the next visit here.  Total time spent: 45 mins including face to face time and time spent for planning, charting and coordination of care  Rulon Eisenmenger, MD, MPH 01/27/2021  I, Thana Ates, am acting as scribe for Dr. Nicholas Lose.  I have reviewed the above documentation for accuracy and completeness, and I agree with the above.

## 2021-01-26 NOTE — Assessment & Plan Note (Signed)
06/03/2001: Excisional biopsy of left breast mass revealing a 2.3 x 1.7 x 1.2 cm invasive ductal carcinoma, grade 2, with associated cribiform and papillary subtype features (<5% of tumor).  ER + (96%), PR -, HER2 -.  Surgical margins focally positive. 4 cycles of AC XRT Current Treatment: Anastrozole since 10/2001

## 2021-01-27 ENCOUNTER — Other Ambulatory Visit: Payer: Self-pay

## 2021-01-27 ENCOUNTER — Inpatient Hospital Stay (HOSPITAL_BASED_OUTPATIENT_CLINIC_OR_DEPARTMENT_OTHER): Payer: Medicare Other | Admitting: Hematology and Oncology

## 2021-01-27 ENCOUNTER — Inpatient Hospital Stay: Payer: Medicare Other | Attending: Hematology and Oncology

## 2021-01-27 DIAGNOSIS — Z170 Estrogen receptor positive status [ER+]: Secondary | ICD-10-CM | POA: Insufficient documentation

## 2021-01-27 DIAGNOSIS — D631 Anemia in chronic kidney disease: Secondary | ICD-10-CM | POA: Insufficient documentation

## 2021-01-27 DIAGNOSIS — C50512 Malignant neoplasm of lower-outer quadrant of left female breast: Secondary | ICD-10-CM

## 2021-01-27 DIAGNOSIS — G20 Parkinson's disease: Secondary | ICD-10-CM | POA: Diagnosis not present

## 2021-01-27 DIAGNOSIS — I130 Hypertensive heart and chronic kidney disease with heart failure and stage 1 through stage 4 chronic kidney disease, or unspecified chronic kidney disease: Secondary | ICD-10-CM | POA: Insufficient documentation

## 2021-01-27 DIAGNOSIS — I509 Heart failure, unspecified: Secondary | ICD-10-CM | POA: Diagnosis not present

## 2021-01-27 DIAGNOSIS — N189 Chronic kidney disease, unspecified: Secondary | ICD-10-CM | POA: Insufficient documentation

## 2021-01-27 DIAGNOSIS — M818 Other osteoporosis without current pathological fracture: Secondary | ICD-10-CM

## 2021-01-27 DIAGNOSIS — M8580 Other specified disorders of bone density and structure, unspecified site: Secondary | ICD-10-CM

## 2021-01-27 LAB — CBC WITH DIFFERENTIAL/PLATELET
Abs Immature Granulocytes: 0.06 10*3/uL (ref 0.00–0.07)
Basophils Absolute: 0 10*3/uL (ref 0.0–0.1)
Basophils Relative: 0 %
Eosinophils Absolute: 0.2 10*3/uL (ref 0.0–0.5)
Eosinophils Relative: 2 %
HCT: 34.4 % — ABNORMAL LOW (ref 36.0–46.0)
Hemoglobin: 11.1 g/dL — ABNORMAL LOW (ref 12.0–15.0)
Immature Granulocytes: 1 %
Lymphocytes Relative: 12 %
Lymphs Abs: 1.3 10*3/uL (ref 0.7–4.0)
MCH: 31.3 pg (ref 26.0–34.0)
MCHC: 32.3 g/dL (ref 30.0–36.0)
MCV: 96.9 fL (ref 80.0–100.0)
Monocytes Absolute: 1 10*3/uL (ref 0.1–1.0)
Monocytes Relative: 9 %
Neutro Abs: 8.6 10*3/uL — ABNORMAL HIGH (ref 1.7–7.7)
Neutrophils Relative %: 76 %
Platelets: 189 10*3/uL (ref 150–400)
RBC: 3.55 MIL/uL — ABNORMAL LOW (ref 3.87–5.11)
RDW: 13.1 % (ref 11.5–15.5)
WBC: 11.2 10*3/uL — ABNORMAL HIGH (ref 4.0–10.5)
nRBC: 0 % (ref 0.0–0.2)

## 2021-01-27 LAB — COMPREHENSIVE METABOLIC PANEL
ALT: <5 U/L (ref 0–44)
AST: 14 U/L — ABNORMAL LOW (ref 15–41)
Albumin: 4.1 g/dL (ref 3.5–5.0)
Alkaline Phosphatase: 54 U/L (ref 38–126)
Anion gap: 6 (ref 5–15)
BUN: 27 mg/dL — ABNORMAL HIGH (ref 8–23)
CO2: 31 mmol/L (ref 22–32)
Calcium: 9.6 mg/dL (ref 8.9–10.3)
Chloride: 100 mmol/L (ref 98–111)
Creatinine, Ser: 1.19 mg/dL — ABNORMAL HIGH (ref 0.44–1.00)
GFR, Estimated: 46 mL/min — ABNORMAL LOW (ref >60)
Glucose, Bld: 105 mg/dL — ABNORMAL HIGH (ref 70–99)
Potassium: 3.8 mmol/L (ref 3.5–5.1)
Sodium: 137 mmol/L (ref 135–145)
Total Bilirubin: 0.3 mg/dL (ref 0.3–1.2)
Total Protein: 6.6 g/dL (ref 6.5–8.1)

## 2021-02-27 ENCOUNTER — Encounter: Payer: Self-pay | Admitting: Oncology

## 2021-02-27 NOTE — Telephone Encounter (Signed)
No entry 

## 2021-03-20 NOTE — Progress Notes (Incomplete)
Patient Care Team: Long, Anola Gurney., MD as PCP - General (Internal Medicine) Magrinat, Virgie Dad, MD (Inactive) (Hematology and Oncology) Gery Pray, MD as Consulting Physician (Radiation Oncology) Neldon Mc, MD as Consulting Physician (General Surgery) Snapper, Laban Emperor, MD as Consulting Physician (Pulmonary Disease) Bartolo Darter, MD as Referring Physician (Cardiology) Ihor Dow, MD as Referring Physician (Pulmonary Disease) Lenox Ahr, MD as Referring Physician (Neurology)  DIAGNOSIS: No diagnosis found.  SUMMARY OF ONCOLOGIC HISTORY: Oncology History  Malignant neoplasm of lower-outer quadrant of left breast of female, estrogen receptor positive ([place])  05/2001 Breast US   Revealed abnormality and patient proceeded to excisional biopsy    06/03/2001 Surgery   Excisional biopsy of left breast mass revealing a 2.3 x 1.7 x 1.2 cm invasive ductal carcinoma, grade 2, with associated cribiform and papillary subtype features (<5% of tumor).  ER + (96%), PR -, HER2 -.  Surgical margins focally positive.   06/10/2001 Definitive Surgery   Re-excision of left breast with left axillary sentinel lymph node biospy. 1 sentinel lymph node removed and was negative.  Clear surgical margins after re-excision.    07/09/2001 Echocardiogram   Pre-chemotherapy MUGA performed revealed EF 75-78%.    2003 - 2003 Chemotherapy   Completed 4 cycles of Doxorubicin and Cyclophosphamide. [note: medical records date back to before EPIC integration].   2003 - 10/2001 Radiation Therapy   [no available records in EPIC]   10/2001 -  Anti-estrogen oral therapy   Initiated treatment with anastrazole.  Initial plan for duration of treatment was 5 years, however current plan is to continue indefinitely per Dr. Jana Hakim and patient preference.      Survivorship   Patient is eligible for Survivorship Clinic and will plan to see her in 10/2014.      CHIEF COMPLIANT:  Follow-up of left breast cancer   INTERVAL HISTORY: Catherine Carlson is a   81 y.o. with above-mentioned history of left breast cancer. She presents to the clinic today for follow-up. She states  ALLERGIES:  is allergic to morphine and related, penicillins, and codeine.  MEDICATIONS:  Current Outpatient Medications  Medication Sig Dispense Refill   amLODipine (NORVASC) 10 MG tablet Take 10 mg by mouth daily.     aspirin 81 MG tablet Take 81 mg by mouth daily.       calcium citrate (CALCITRATE - DOSED IN MG ELEMENTAL CALCIUM) 950 MG tablet Take 1 tablet by mouth daily.       carbamazepine (TEGRETOL) 200 MG tablet Take 400 mg by mouth at bedtime.      carbidopa-levodopa (SINEMET IR) 25-100 MG tablet Take 1 tablet by mouth 3 (three) times daily.     carvedilol (COREG) 12.5 MG tablet Take 12.5 mg by mouth 2 (two) times daily with a meal.      cholecalciferol (VITAMIN D) 1000 UNITS tablet Take 2,000 Units by mouth daily.      escitalopram (LEXAPRO) 10 MG tablet Take 10 mg by mouth daily.     levothyroxine (SYNTHROID, LEVOTHROID) 112 MCG tablet Take 112 mcg by mouth daily before breakfast.     lisinopril (ZESTRIL) 5 MG tablet Take 5 mg by mouth daily.     triamterene-hydrochlorothiazide (DYAZIDE) 37.5-25 MG capsule Take 1 capsule by mouth daily.     vitamin B-12 (CYANOCOBALAMIN) 500 MCG tablet Take 1 tablet (500 mcg total) by mouth daily. 90 tablet 4   No current facility-administered medications for this visit.    PHYSICAL EXAMINATION: ECOG PERFORMANCE  STATUS: {CHL ONC ECOG PS:[id number]}  There were no vitals filed for this visit. There were no vitals filed for this visit.  BREAST:*** No palpable masses or nodules in either right or left breasts. No palpable axillary supraclavicular or infraclavicular adenopathy no breast tenderness or nipple discharge. (exam performed in the presence of a chaperone)  LABORATORY DATA:  I have reviewed the data as listed CMP Latest Ref Rng & Units  01/27/2021 08/03/2020 01/27/2020  Glucose 70 - 99 mg/dL 105(H) 106(H) 113(H)  BUN 8 - 23 mg/dL 27(H) 28(H) 24(H)  Creatinine 0.44 - 1.00 mg/dL 1.19(H) 1.34(H) 1.16(H)  Sodium 135 - 145 mmol/L 137 135 139  Potassium 3.5 - 5.1 mmol/L 3.8 4.1 3.5  Chloride 98 - 111 mmol/L 100 95(L) 100  CO2 22 - 32 mmol/L 31 31 30   Calcium 8.9 - 10.3 mg/dL 9.6 9.9 9.4  Total Protein 6.5 - 8.1 g/dL 6.6 6.5 6.7  Total Bilirubin 0.3 - 1.2 mg/dL 0.3 0.4 0.3  Alkaline Phos 38 - 126 U/L 54 60 60  AST 15 - 41 U/L 14(L) 13(L) 13(L)  ALT 0 - 44 U/L <5 <6 <6    Lab Results  Component Value Date   WBC 11.2 (H) 01/27/2021   HGB 11.1 (L) 01/27/2021   HCT 34.4 (L) 01/27/2021   MCV 96.9 01/27/2021   PLT 189 01/27/2021   NEUTROABS 8.6 (H) 01/27/2021    ASSESSMENT & PLAN:  No problem-specific Assessment & Plan notes found for this encounter.    No orders of the defined types were placed in this encounter.  The patient has a good understanding of the overall plan. she agrees with it. she will call with any problems that may develop before the next visit here. Total time spent: 30 mins including face to face time and time spent for planning, charting and co-ordination of care   Suzzette Righter, [place] 03/20/21    I, Gardiner Coins, am acting as a Education administrator for Dr. Lindi Adie.

## 2021-03-21 ENCOUNTER — Other Ambulatory Visit: Payer: Self-pay | Admitting: *Deleted

## 2021-03-21 DIAGNOSIS — C50512 Malignant neoplasm of lower-outer quadrant of left female breast: Secondary | ICD-10-CM

## 2021-03-22 ENCOUNTER — Inpatient Hospital Stay (HOSPITAL_BASED_OUTPATIENT_CLINIC_OR_DEPARTMENT_OTHER): Payer: Medicare Other | Admitting: Hematology and Oncology

## 2021-03-22 ENCOUNTER — Other Ambulatory Visit: Payer: Self-pay

## 2021-03-22 ENCOUNTER — Inpatient Hospital Stay: Payer: Medicare Other | Attending: Hematology and Oncology

## 2021-03-22 ENCOUNTER — Inpatient Hospital Stay: Payer: Medicare Other

## 2021-03-22 DIAGNOSIS — G20 Parkinson's disease: Secondary | ICD-10-CM | POA: Insufficient documentation

## 2021-03-22 DIAGNOSIS — N189 Chronic kidney disease, unspecified: Secondary | ICD-10-CM | POA: Diagnosis not present

## 2021-03-22 DIAGNOSIS — Z170 Estrogen receptor positive status [ER+]: Secondary | ICD-10-CM | POA: Diagnosis not present

## 2021-03-22 DIAGNOSIS — I509 Heart failure, unspecified: Secondary | ICD-10-CM | POA: Insufficient documentation

## 2021-03-22 DIAGNOSIS — Z853 Personal history of malignant neoplasm of breast: Secondary | ICD-10-CM | POA: Diagnosis not present

## 2021-03-22 DIAGNOSIS — D631 Anemia in chronic kidney disease: Secondary | ICD-10-CM | POA: Insufficient documentation

## 2021-03-22 DIAGNOSIS — M810 Age-related osteoporosis without current pathological fracture: Secondary | ICD-10-CM | POA: Diagnosis present

## 2021-03-22 DIAGNOSIS — C50512 Malignant neoplasm of lower-outer quadrant of left female breast: Secondary | ICD-10-CM | POA: Diagnosis not present

## 2021-03-22 LAB — CMP (CANCER CENTER ONLY)
ALT: <5 U/L (ref 0–44)
AST: 14 U/L — ABNORMAL LOW (ref 15–41)
Albumin: 4.2 g/dL (ref 3.5–5.0)
Alkaline Phosphatase: 64 U/L (ref 38–126)
Anion gap: 5 (ref 5–15)
BUN: 33 mg/dL — ABNORMAL HIGH (ref 8–23)
CO2: 33 mmol/L — ABNORMAL HIGH (ref 22–32)
Calcium: 10.1 mg/dL (ref 8.9–10.3)
Chloride: 98 mmol/L (ref 98–111)
Creatinine: 1.33 mg/dL — ABNORMAL HIGH (ref 0.44–1.00)
GFR, Estimated: 40 mL/min — ABNORMAL LOW (ref >60)
Glucose, Bld: 104 mg/dL — ABNORMAL HIGH (ref 70–99)
Potassium: 4 mmol/L (ref 3.5–5.1)
Sodium: 136 mmol/L (ref 135–145)
Total Bilirubin: 0.3 mg/dL (ref 0.3–1.2)
Total Protein: 6.7 g/dL (ref 6.5–8.1)

## 2021-03-22 LAB — CBC WITH DIFFERENTIAL (CANCER CENTER ONLY)
Abs Immature Granulocytes: 0.04 10*3/uL (ref 0.00–0.07)
Basophils Absolute: 0 10*3/uL (ref 0.0–0.1)
Basophils Relative: 0 %
Eosinophils Absolute: 0.1 10*3/uL (ref 0.0–0.5)
Eosinophils Relative: 1 %
HCT: 36 % (ref 36.0–46.0)
Hemoglobin: 11.7 g/dL — ABNORMAL LOW (ref 12.0–15.0)
Immature Granulocytes: 1 %
Lymphocytes Relative: 19 %
Lymphs Abs: 1.3 10*3/uL (ref 0.7–4.0)
MCH: 31.3 pg (ref 26.0–34.0)
MCHC: 32.5 g/dL (ref 30.0–36.0)
MCV: 96.3 fL (ref 80.0–100.0)
Monocytes Absolute: 0.7 10*3/uL (ref 0.1–1.0)
Monocytes Relative: 10 %
Neutro Abs: 4.7 10*3/uL (ref 1.7–7.7)
Neutrophils Relative %: 69 %
Platelet Count: 172 10*3/uL (ref 150–400)
RBC: 3.74 MIL/uL — ABNORMAL LOW (ref 3.87–5.11)
RDW: 12.9 % (ref 11.5–15.5)
WBC Count: 6.9 10*3/uL (ref 4.0–10.5)
nRBC: 0 % (ref 0.0–0.2)

## 2021-03-22 NOTE — Assessment & Plan Note (Signed)
06/03/2001: Excisional biopsy of left breast mass revealing a 2.3 x 1.7 x 1.2 cm invasive ductal carcinoma, grade 2, with associated cribiform and papillary subtype features (<5% of tumor).  ER + (96%), PR -, HER2 -.  Surgical margins focally positive. ?4 cycles of AC ?XRT ?Current Treatment: Anastrozole since 10/2001 completed 2021 ?? ?Anemia of chronic disease: Hemoglobin is 11.1.  She has chronic kidney disease as well as chronic congestive heart failure along with Parkinson's disease. ?I discussed with her that given all of these findings, her anemia is generally very mild and therefore does not require aggressive interventions like colonoscopy or bone marrow evaluation etc.  She can be watched and monitored.  If the hemoglobin goes below 10 then we can consider her for Aranesp therapy. ?? ?Return to clinic in 1 year for follow-up ?

## 2021-03-23 ENCOUNTER — Telehealth: Payer: Self-pay | Admitting: Hematology and Oncology

## 2021-03-23 NOTE — Telephone Encounter (Signed)
Scheduled appointment per 3/15 los. Patient is aware. Patient will be mailed an updated calendar. ?

## 2022-01-23 ENCOUNTER — Encounter: Payer: Self-pay | Admitting: Oncology

## 2022-01-29 ENCOUNTER — Inpatient Hospital Stay: Payer: Medicare Other | Attending: Hematology and Oncology | Admitting: Hematology and Oncology

## 2022-01-29 ENCOUNTER — Other Ambulatory Visit: Payer: Self-pay

## 2022-01-29 VITALS — BP 100/59 | HR 93 | Temp 97.4°F | Resp 16 | Ht 65.0 in | Wt 107.6 lb

## 2022-01-29 DIAGNOSIS — C50512 Malignant neoplasm of lower-outer quadrant of left female breast: Secondary | ICD-10-CM | POA: Diagnosis not present

## 2022-01-29 DIAGNOSIS — Z1231 Encounter for screening mammogram for malignant neoplasm of breast: Secondary | ICD-10-CM | POA: Diagnosis not present

## 2022-01-29 DIAGNOSIS — Z79899 Other long term (current) drug therapy: Secondary | ICD-10-CM | POA: Insufficient documentation

## 2022-01-29 DIAGNOSIS — D638 Anemia in other chronic diseases classified elsewhere: Secondary | ICD-10-CM | POA: Diagnosis not present

## 2022-01-29 DIAGNOSIS — R000 Tachycardia, unspecified: Secondary | ICD-10-CM | POA: Diagnosis not present

## 2022-01-29 DIAGNOSIS — M810 Age-related osteoporosis without current pathological fracture: Secondary | ICD-10-CM | POA: Diagnosis not present

## 2022-01-29 DIAGNOSIS — Z170 Estrogen receptor positive status [ER+]: Secondary | ICD-10-CM | POA: Diagnosis not present

## 2022-01-29 DIAGNOSIS — I4891 Unspecified atrial fibrillation: Secondary | ICD-10-CM | POA: Diagnosis not present

## 2022-01-29 NOTE — Progress Notes (Signed)
Patient Care Team: Long, Anola Gurney., MD as PCP - General (Internal Medicine) Magrinat, Virgie Dad, MD (Inactive) (Hematology and Oncology) Gery Pray, MD as Consulting Physician (Radiation Oncology) Neldon Mc, MD (Inactive) as Consulting Physician (General Surgery) Idamae Schuller, Laban Emperor, MD as Consulting Physician (Pulmonary Disease) Bartolo Darter, MD as Referring Physician (Cardiology) Ihor Dow, MD as Referring Physician (Pulmonary Disease) Lenox Ahr, MD as Referring Physician (Neurology)  DIAGNOSIS:  Encounter Diagnoses  Name Primary?   Malignant neoplasm of lower-outer quadrant of left breast of female, estrogen receptor positive ([place]) Yes   Encounter for screening mammogram for malignant neoplasm of breast     SUMMARY OF ONCOLOGIC HISTORY: Oncology History  Malignant neoplasm of lower-outer quadrant of left breast of female, estrogen receptor positive ([place])  05/2001 Breast US   Revealed abnormality and patient proceeded to excisional biopsy    06/03/2001 Surgery   Excisional biopsy of left breast mass revealing a 2.3 x 1.7 x 1.2 cm invasive ductal carcinoma, grade 2, with associated cribiform and papillary subtype features (<5% of tumor).  ER + (96%), PR -, HER2 -.  Surgical margins focally positive.   06/10/2001 Definitive Surgery   Re-excision of left breast with left axillary sentinel lymph node biospy. 1 sentinel lymph node removed and was negative.  Clear surgical margins after re-excision.    07/09/2001 Echocardiogram   Pre-chemotherapy MUGA performed revealed EF 75-78%.    2003 - 2003 Chemotherapy   Completed 4 cycles of Doxorubicin and Cyclophosphamide. [note: medical records date back to before EPIC integration].   2003 - 10/2001 Radiation Therapy   [no available records in EPIC]   10/2001 -  Anti-estrogen oral therapy   Initiated treatment with anastrazole.  Initial plan for duration of treatment was 5 years, however  current plan is to continue indefinitely per Dr. Jana Hakim and patient preference.      Survivorship   Patient is eligible for Survivorship Clinic and will plan to see her in 10/2014.      CHIEF COMPLIANT:  Follow-up Chronic Anemia    INTERVAL HISTORY: Catherine Carlson is a 82 y.o. with above-mentioned history of left breast cancer. She presents to the clinic today for follow-up. She was in [place] she had a cardioversion. She has been going back and forth to [facility]. She also lost her husband. She says she has falling a lot In the past year.   ALLERGIES:  is allergic to morphine and related, penicillins, and codeine.  MEDICATIONS:  Current Outpatient Medications  Medication Sig Dispense Refill   amLODipine (NORVASC) 10 MG tablet Take 10 mg by mouth daily.     apixaban (ELIQUIS) 2.5 MG TABS tablet Take by mouth 2 (two) times daily.     aspirin 81 MG tablet Take 81 mg by mouth daily.       atorvastatin (LIPITOR) 20 MG tablet Take 20 mg by mouth daily.     calcium citrate (CALCITRATE - DOSED IN MG ELEMENTAL CALCIUM) 950 MG tablet Take 1 tablet by mouth daily.       carbamazepine (TEGRETOL) 200 MG tablet Take 400 mg by mouth at bedtime.      carbidopa-levodopa (SINEMET IR) 25-100 MG tablet Take 1 tablet by mouth 3 (three) times daily.     carvedilol (COREG) 12.5 MG tablet Take 12.5 mg by mouth 2 (two) times daily with a meal.      cholecalciferol (VITAMIN D) 1000 UNITS tablet Take 2,000 Units by mouth daily.  denosumab (PROLIA) 60 MG/ML SOSY injection 60 mg.     diltiazem (CARDIZEM SR) 120 MG 12 hr capsule Take 1 capsule by mouth daily.     escitalopram (LEXAPRO) 10 MG tablet Take 10 mg by mouth daily.     fluticasone (FLONASE) 50 MCG/ACT nasal spray Place into both nostrils daily.     levothyroxine (SYNTHROID, LEVOTHROID) 112 MCG tablet Take 112 mcg by mouth daily before breakfast.     lisinopril (ZESTRIL) 5 MG tablet Take 5 mg by mouth daily.     losartan (COZAAR) 50 MG tablet 50  mg.     MAG64 64 MG TBEC Take 1 tablet by mouth daily.     magnesium chloride (SLOW-MAG) 64 MG TBEC SR tablet Take by mouth.     metoprolol succinate (TOPROL-XL) 25 MG 24 hr tablet Take 1 tablet by mouth 2 (two) times daily.     metoprolol tartrate (LOPRESSOR) 25 MG tablet Take 25 mg by mouth 2 (two) times daily.     potassium chloride SA (KLOR-CON M) 20 MEQ tablet Take 1 tablet by mouth daily.     triamterene-hydrochlorothiazide (DYAZIDE) 37.5-25 MG capsule Take 1 capsule by mouth daily.     vitamin B-12 (CYANOCOBALAMIN) 500 MCG tablet Take 1 tablet (500 mcg total) by mouth daily. 90 tablet 4   No current facility-administered medications for this visit.    PHYSICAL EXAMINATION: ECOG PERFORMANCE STATUS: 1 - Symptomatic but completely ambulatory  Vitals:   01/29/22 1547  BP: (!) 100/59  Pulse: 93  Resp: 16  Temp: (!) 97.4 F (36.3 C)  SpO2: 100%   Filed Weights   01/29/22 1547  Weight: 107 lb 9.6 oz (48.8 kg)      LABORATORY DATA:  I have reviewed the data as listed    Latest Ref Rng & Units 03/22/2021   11:39 AM 01/27/2021    8:56 AM 08/03/2020    1:47 PM  CMP  Glucose 70 - 99 mg/dL 104  105  106   BUN 8 - 23 mg/dL 33  27  28   Creatinine 0.44 - 1.00 mg/dL 1.33  1.19  1.34   Sodium 135 - 145 mmol/L 136  137  135   Potassium 3.5 - 5.1 mmol/L 4.0  3.8  4.1   Chloride 98 - 111 mmol/L 98  100  95   CO2 22 - 32 mmol/L 33  31  31   Calcium 8.9 - 10.3 mg/dL 10.1  9.6  9.9   Total Protein 6.5 - 8.1 g/dL 6.7  6.6  6.5   Total Bilirubin 0.3 - 1.2 mg/dL 0.3  0.3  0.4   Alkaline Phos 38 - 126 U/L 64  54  60   AST 15 - 41 U/L '14  14  13   '$ ALT 0 - 44 U/L <5  <5  <6     Lab Results  Component Value Date   WBC 6.9 03/22/2021   HGB 11.7 (L) 03/22/2021   HCT 36.0 03/22/2021   MCV 96.3 03/22/2021   PLT 172 03/22/2021   NEUTROABS 4.7 03/22/2021    ASSESSMENT & PLAN:  Malignant neoplasm of lower-outer quadrant of left breast of female, estrogen receptor positive  ([place]) 06/03/2001: Excisional biopsy of left breast mass revealing a 2.3 x 1.7 x 1.2 cm invasive ductal carcinoma, grade 2, with associated cribiform and papillary subtype features (<5% of tumor).  ER + (96%), PR -, HER2 -.  Surgical margins focally positive. 4 cycles of AC  XRT Current Treatment: Anastrozole since 10/2001 completed 2021   Anemia of chronic disease: Most recent blood work showed normal hemoglobin.  Osteoporosis: Previously was on Prolia injections.  She has not received it in the last year and a half.  This is because of her multiple cardiac issues as well as her husband passing away. We discussed the pros and cons of resuming Prolia and decided to not treat her any further with Prolia given her overall health and her age.  Atrial fibrillation with fluctuating tachycardia and bradycardia.  Under the care of [facility].  She had cardioversion recently.  Currently on anticoagulation.  Return to clinic on an as-needed basis.    Orders Placed This Encounter  Procedures   MM DIGITAL SCREENING BILATERAL    Standing Status:   Future    Standing Expiration Date:   01/30/2023    Scheduling Instructions:     Please call daughter in law Catherine Carlson [phone]     Afternoon appt.    Order Specific Question:   Reason for Exam (SYMPTOM  OR DIAGNOSIS REQUIRED)    Answer:   annual mammogram    Order Specific Question:   Preferred imaging location?    Answer:   [facility]   The patient has a good understanding of the overall plan. she agrees with it. she will call with any problems that may develop before the next visit here. Total time spent: 30 mins including face to face time and time spent for planning, charting and co-ordination of care   Harriette Ohara, MD 01/29/22    I Gardiner Coins am acting as a Education administrator for [employer]  I have reviewed the above documentation for accuracy and completeness, and I agree with the above.

## 2022-01-29 NOTE — Assessment & Plan Note (Signed)
06/03/2001: Excisional biopsy of left breast mass revealing a 2.3 x 1.7 x 1.2 cm invasive ductal carcinoma, grade 2, with associated cribiform and papillary subtype features (<5% of tumor).  ER + (96%), PR -, HER2 -.  Surgical margins focally positive. 4 cycles of AC XRT Current Treatment: Anastrozole since 10/2001 completed 2021   Anemia of chronic disease: Hemoglobin is 11.7.  She has chronic kidney disease as well as chronic congestive heart failure along with Parkinson's disease.  Osteoporosis: Currently on Prolia injections. She has been on bisphosphonate therapy for several years. Therefore I would like to give her a break from bisphosphonates for 2 years.

## 2022-01-30 ENCOUNTER — Inpatient Hospital Stay: Payer: Medicare Other | Admitting: Hematology and Oncology

## 2022-02-26 ENCOUNTER — Ambulatory Visit: Payer: Medicare Other

## 2022-03-26 ENCOUNTER — Ambulatory Visit: Payer: Medicare Other | Admitting: Hematology and Oncology

## 2022-03-29 ENCOUNTER — Ambulatory Visit: Payer: Medicare Other

## 2022-07-03 ENCOUNTER — Encounter: Payer: Self-pay | Admitting: Oncology
